# Patient Record
Sex: Male | Born: 1995
Health system: Southern US, Community
[De-identification: ages and names within clinical notes are randomized; demographics above are authoritative.]

## PROBLEM LIST (undated history)

## (undated) ENCOUNTER — Ambulatory Visit

## (undated) DIAGNOSIS — E119 Type 2 diabetes mellitus without complications: Secondary | ICD-10-CM

## (undated) DIAGNOSIS — I498 Other specified cardiac arrhythmias: Secondary | ICD-10-CM

## (undated) DIAGNOSIS — R200 Anesthesia of skin: Secondary | ICD-10-CM

## (undated) DIAGNOSIS — I10 Essential (primary) hypertension: Secondary | ICD-10-CM

## (undated) DIAGNOSIS — R202 Paresthesia of skin: Secondary | ICD-10-CM

## (undated) HISTORY — DX: Paresthesia of skin: R20.0

## (undated) HISTORY — DX: Paresthesia of skin: R20.2

## (undated) HISTORY — PX: NO PAST SURGERIES: SHX2092

## (undated) HISTORY — DX: Other specified cardiac arrhythmias: I49.8

---
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
## 2017-02-20 ENCOUNTER — Encounter (HOSPITAL_COMMUNITY): Payer: Self-pay | Admitting: *Deleted

## 2017-02-20 ENCOUNTER — Emergency Department (HOSPITAL_COMMUNITY)
Admission: EM | Admit: 2017-02-20 | Discharge: 2017-02-20 | Disposition: A | Discharge status: Home / Self Care | Payer: Self-pay | Attending: Emergency Medicine | Admitting: Emergency Medicine

## 2017-02-20 DIAGNOSIS — Z79899 Other long term (current) drug therapy: Secondary | ICD-10-CM | POA: Insufficient documentation

## 2017-02-20 DIAGNOSIS — Z794 Long term (current) use of insulin: Secondary | ICD-10-CM | POA: Insufficient documentation

## 2017-02-20 DIAGNOSIS — R739 Hyperglycemia, unspecified: Secondary | ICD-10-CM

## 2017-02-20 DIAGNOSIS — R51 Headache: Secondary | ICD-10-CM | POA: Insufficient documentation

## 2017-02-20 DIAGNOSIS — E1165 Type 2 diabetes mellitus with hyperglycemia: Secondary | ICD-10-CM | POA: Insufficient documentation

## 2017-02-20 DIAGNOSIS — I10 Essential (primary) hypertension: Secondary | ICD-10-CM | POA: Insufficient documentation

## 2017-02-20 LAB — URINALYSIS, ROUTINE W REFLEX MICROSCOPIC
Bacteria, UA: NONE SEEN
Bilirubin Urine: NEGATIVE
Glucose, UA: >=500 mg/dL — AB
Hgb urine dipstick: NEGATIVE
Ketones, ur: NEGATIVE mg/dL
Leukocytes, UA: NEGATIVE
Nitrite: NEGATIVE
Protein, ur: 30 mg/dL — AB
Specific Gravity, Urine: 1.027 (ref 1.005–1.030)
Squamous Epithelial / HPF: NONE SEEN
pH: 5 (ref 5.0–8.0)

## 2017-02-20 LAB — CBC
HEMATOCRIT: 43.2 % (ref 39.0–52.0)
Hemoglobin: 14.9 g/dL (ref 13.0–17.0)
MCH: 27.9 pg (ref 26.0–34.0)
MCHC: 34.5 g/dL (ref 30.0–36.0)
MCV: 80.7 fL (ref 78.0–100.0)
PLATELETS: 336 10*3/uL (ref 150–400)
RBC: 5.35 MIL/uL (ref 4.22–5.81)
RDW: 12.4 % (ref 11.5–15.5)
WBC: 8.4 10*3/uL (ref 4.0–10.5)

## 2017-02-20 LAB — COMPREHENSIVE METABOLIC PANEL
ALT: 58 U/L (ref 17–63)
AST: 31 U/L (ref 15–41)
Albumin: 4.1 g/dL (ref 3.5–5.0)
Alkaline Phosphatase: 83 U/L (ref 38–126)
Anion gap: 9 (ref 5–15)
BILIRUBIN TOTAL: 0.5 mg/dL (ref 0.3–1.2)
BUN: 6 mg/dL (ref 6–20)
CO2: 24 mmol/L (ref 22–32)
Calcium: 9.1 mg/dL (ref 8.9–10.3)
Chloride: 100 mmol/L — ABNORMAL LOW (ref 101–111)
Creatinine, Ser: 0.87 mg/dL (ref 0.61–1.24)
GFR calc Af Amer: >60 mL/min (ref >60)
GFR calc non Af Amer: >60 mL/min (ref >60)
Glucose, Bld: 308 mg/dL — ABNORMAL HIGH (ref 65–99)
POTASSIUM: 4.2 mmol/L (ref 3.5–5.1)
SODIUM: 133 mmol/L — AB (ref 135–145)
TOTAL PROTEIN: 7.6 g/dL (ref 6.5–8.1)

## 2017-02-20 LAB — CBG MONITORING, ED
Glucose-Capillary: 303 mg/dL — ABNORMAL HIGH (ref 65–99)
Glucose-Capillary: 196 mg/dL — ABNORMAL HIGH (ref 65–99)

## 2017-02-20 LAB — LIPASE, BLOOD: LIPASE: 23 U/L (ref 11–51)

## 2017-02-20 MED ORDER — SODIUM CHLORIDE 0.9 % IV BOLUS (SEPSIS)
1000.0000 mL | Freq: Once | INTRAVENOUS | Status: AC
  Administered 2017-02-20: 1000 mL via INTRAVENOUS

## 2017-02-20 MED ORDER — LISINOPRIL 5 MG PO TABS: 5.0000 mg | ORAL_TABLET | Freq: Every day | ORAL | 1 refills | Status: DC

## 2017-02-20 MED ORDER — METFORMIN HCL 500 MG PO TABS: 500.0000 mg | ORAL_TABLET | Freq: Two times a day (BID) | ORAL | 1 refills | Status: DC

## 2017-02-20 MED ORDER — INSULIN ASPART 100 UNIT/ML ~~LOC~~ SOLN
10.0000 [IU] | Freq: Once | SUBCUTANEOUS | Status: AC
  Administered 2017-02-20: 10 [IU] via SUBCUTANEOUS
  Filled 2017-02-20: qty 1

## 2017-02-20 MED ORDER — INSULIN ASPART PROT & ASPART (70-30 MIX) 100 UNIT/ML PEN: 28.0000 [IU] | PEN_INJECTOR | SUBCUTANEOUS | 5 refills | Status: DC

## 2017-02-20 MED ORDER — ONDANSETRON HCL 4 MG/2ML IJ SOLN
4.0000 mg | Freq: Once | INTRAMUSCULAR | Status: AC
  Administered 2017-02-20: 4 mg via INTRAVENOUS
  Filled 2017-02-20: qty 2

## 2017-02-20 MED ORDER — METFORMIN HCL 500 MG PO TABS
500.0000 mg | ORAL_TABLET | Freq: Once | ORAL | Status: AC
  Administered 2017-02-20: 500 mg via ORAL
  Filled 2017-02-20: qty 1

## 2017-02-20 MED ORDER — ONDANSETRON HCL 4 MG PO TABS: 4.0000 mg | ORAL_TABLET | Freq: Three times a day (TID) | ORAL | 0 refills | Status: DC | PRN

## 2017-02-20 MED ORDER — LISINOPRIL 2.5 MG PO TABS
5.0000 mg | ORAL_TABLET | Freq: Once | ORAL | Status: AC
  Administered 2017-02-20: 5 mg via ORAL
  Filled 2017-02-20: qty 2

## 2017-02-20 NOTE — Care Management (Signed)
ED CM consulted by Dr. Dayna Barker concerning medication assistance and follow up patient presented to [facility] ED with c/o elevated CBG with nausea and abdominal pain. ED CM met with patient and mother at bedside to discuss medication assistance and establishing care. Patient lost Medicaid when he turned 19 last year and has not been able to afford follow up care. Patient has been sharing medications.  CM discussed MATCH  program and the guidelines including the  $3 co-pay per prescription. Pt verbalized understanding and is agreeable with accepting the assistance.  Pt enrolled and [place] letter printed and given to patient with list of participating pharmacies.  Patient was instructed to present  prescriptions with [employer] letter to a participating Pharmacies from the list providedPatient is an insulin dependent diabetic.

## 2017-02-20 NOTE — ED Triage Notes (Signed)
pT ARRIVED VIA [facility] WITH OUT OF MEDS FOR DM AND HTN FOR ONE WEEK.  PT IS HERE WITH ABDOMINAL PAIN, NAUSEA, VOMITING, HEADACHE, CHILLS, BLURRY VISION.  NSL AND RECEIVED 100 CC OF NS EN ROUTE TO ED.  CBG 253 FOR EMS.  RECEIVED ZOFRAN  BY EMS.

## 2017-02-20 NOTE — ED Provider Notes (Signed)
Emergency Department Provider Note   I have reviewed the triage vital signs and the nursing notes.   HISTORY  Chief Complaint Abdominal Pain; Hyperglycemia; and Headache   HPI Logan Dunn is a 21 y.o. male  with a past medical history of diabetes and hypertension presents to the emergency department today secondary to a mild headache along with nausea, pyuria and hyperglycemia secondary to being out of his medications for at least a week. Patient recently came off of Medicaid and has not been able to afford his prescriptions. Does not have a primary doctor for the same reason. Patient had last bowel movementlast night which was normal. He is a couple episodes of nonbloody nonbilious vomiting today. All over abdominal achiness but no specific pain in one area or the other. No urinary symptoms besides urinate more often than normal.has been in DKA one time before when his diagnosis. Otherwise has been relatively well-controlled. No chest pain, cough, fever or other infectious symptoms.   Past Medical History:  Diagnosis Date   Diabetes (HCC)    Hypertension     There are no active problems to display for this patient.   History reviewed. No pertinent surgical history.    Allergies Patient has no known allergies.  No family history on file.  Social History Social History  Substance Use Topics   Smoking status: Never Smoker   Smokeless tobacco: Never Used   Alcohol use No    Review of Systems  All other systems negative except as documented in the HPI. All pertinent positives and negatives as reviewed in the HPI. ____________________________________________   PHYSICAL EXAM:  VITAL SIGNS: ED Triage Vitals  Enc Vitals Group     BP 02/20/17 1311 (!) 151/95     Pulse Rate 02/20/17 1311 74     Resp 02/20/17 1311 18     Temp 02/20/17 1311 98.4 F (36.9 C)     Temp src --      SpO2 02/20/17 1311 96 %     Weight --      Height --      Head Circumference  --      Peak Flow --      Pain Score 02/20/17 1302 10     Pain Loc --      Pain Edu? --      Excl. in GC? --     Constitutional: Alert and oriented. Well appearing and in no acute distress. Eyes: Conjunctivae are normal. PERRL. EOMI. Head: Atraumatic. Nose: No congestion/rhinnorhea. Mouth/Throat: Mucous membranes are moist.  Oropharynx non-erythematous. Neck: No stridor.  No meningeal signs.   Cardiovascular: Normal rate, regular rhythm. Good peripheral circulation. Grossly normal heart sounds.   Respiratory: Normal respiratory effort.  No retractions. Lungs CTAB. Gastrointestinal: Soft and nontender. No distention.  Musculoskeletal: No lower extremity tenderness nor edema. No gross deformities of extremities. Neurologic:  Normal speech and language. No gross focal neurologic deficits are appreciated.  Skin:  Skin is warm, dry and intact. No rash noted.   ____________________________________________   LABS (all labs ordered are listed, but only abnormal results are displayed)  Labs Reviewed  COMPREHENSIVE METABOLIC PANEL - Abnormal; Notable for the following:       Result Value   Sodium 133 (*)    Chloride 100 (*)    Glucose, Bld 308 (*)    All other components within normal limits  URINALYSIS, ROUTINE W REFLEX MICROSCOPIC - Abnormal; Notable for the following:    Glucose, UA >=500 (*)  Protein, ur 30 (*)    All other components within normal limits  CBG MONITORING, ED - Abnormal; Notable for the following:    Glucose-Capillary 303 (*)    All other components within normal limits  CBG MONITORING, ED - Abnormal; Notable for the following:    Glucose-Capillary 196 (*)    All other components within normal limits  LIPASE, BLOOD  CBC   ____________________________________________  EKG   EKG Interpretation  Date/Time:    Ventricular Rate:    PR Interval:    QRS Duration:   QT Interval:    QTC Calculation:   R Axis:     Text Interpretation:          ____________________________________________  RADIOLOGY  No results found.  ____________________________________________   PROCEDURES  Procedure(s) performed:   Procedures   ____________________________________________   INITIAL IMPRESSION / ASSESSMENT AND PLAN / ED COURSE  Pertinent labs & imaging results that were available during my care of the patient were reviewed by me and considered in my medical decision making (see chart for details).  No evidence of DKA. Still awaiting urine. We'll give insulin, fluids and engage case management to help with obtaining medication and PCP follow-up.  Case management gave assistance for medications and will work on helping obtain PCP for further follow up.   Symptoms improved. Tolerating PO. Stable for discharge. Prescriptions given.  ____________________________________________  FINAL CLINICAL IMPRESSION(S) / ED DIAGNOSES  Final diagnoses:  Hyperglycemia     MEDICATIONS GIVEN DURING THIS VISIT:  Medications  sodium chloride 0.9 % bolus 1,000 mL (0 mLs Intravenous Stopped 02/20/17 1910)  ondansetron (ZOFRAN) injection 4 mg (4 mg Intravenous Given 02/20/17 1803)  insulin aspart (novoLOG) injection 10 Units (10 Units Subcutaneous Given 02/20/17 1810)  metFORMIN (GLUCOPHAGE) tablet 500 mg (500 mg Oral Given 02/20/17 1852)  lisinopril (PRINIVIL,ZESTRIL) tablet 5 mg (5 mg Oral Given 02/20/17 1855)     NEW OUTPATIENT MEDICATIONS STARTED DURING THIS VISIT:  Discharge Medication List as of 02/20/2017  6:41 PM    START taking these medications   Details  ondansetron (ZOFRAN) 4 MG tablet Take 1 tablet (4 mg total) by mouth every 8 (eight) hours as needed for nausea or vomiting., Starting Mon 02/20/2017, Print        Note:  This document was prepared using Dragon voice recognition software and may include unintentional dictation errors.   Marily Memos, MD 02/20/17 2130

## 2017-09-09 ENCOUNTER — Encounter (HOSPITAL_COMMUNITY): Payer: Self-pay

## 2017-09-09 ENCOUNTER — Emergency Department (HOSPITAL_COMMUNITY)
Admission: EM | Admit: 2017-09-09 | Discharge: 2017-09-10 | Disposition: A | Discharge status: Home / Self Care | Payer: Self-pay | Attending: Emergency Medicine | Admitting: Emergency Medicine

## 2017-09-09 ENCOUNTER — Other Ambulatory Visit: Payer: Self-pay

## 2017-09-09 DIAGNOSIS — I10 Essential (primary) hypertension: Secondary | ICD-10-CM | POA: Insufficient documentation

## 2017-09-09 DIAGNOSIS — Z79899 Other long term (current) drug therapy: Secondary | ICD-10-CM | POA: Insufficient documentation

## 2017-09-09 DIAGNOSIS — Z794 Long term (current) use of insulin: Secondary | ICD-10-CM | POA: Insufficient documentation

## 2017-09-09 DIAGNOSIS — E1165 Type 2 diabetes mellitus with hyperglycemia: Secondary | ICD-10-CM | POA: Insufficient documentation

## 2017-09-09 DIAGNOSIS — R739 Hyperglycemia, unspecified: Secondary | ICD-10-CM

## 2017-09-09 LAB — CBC
HCT: 46.7 % (ref 39.0–52.0)
HEMOGLOBIN: 16.7 g/dL (ref 13.0–17.0)
MCH: 28.5 pg (ref 26.0–34.0)
MCHC: 35.8 g/dL (ref 30.0–36.0)
MCV: 79.7 fL (ref 78.0–100.0)
Platelets: 373 10*3/uL (ref 150–400)
RBC: 5.86 MIL/uL — ABNORMAL HIGH (ref 4.22–5.81)
RDW: 12.7 % (ref 11.5–15.5)
WBC: 11.6 10*3/uL — ABNORMAL HIGH (ref 4.0–10.5)

## 2017-09-09 LAB — BASIC METABOLIC PANEL
ANION GAP: 12 (ref 5–15)
BUN: 12 mg/dL (ref 6–20)
CALCIUM: 9.8 mg/dL (ref 8.9–10.3)
CO2: 26 mmol/L (ref 22–32)
Chloride: 97 mmol/L — ABNORMAL LOW (ref 101–111)
Creatinine, Ser: 1.12 mg/dL (ref 0.61–1.24)
GFR calc Af Amer: >60 mL/min (ref >60)
GFR calc non Af Amer: >60 mL/min (ref >60)
GLUCOSE: 346 mg/dL — AB (ref 65–99)
Potassium: 3.3 mmol/L — ABNORMAL LOW (ref 3.5–5.1)
Sodium: 135 mmol/L (ref 135–145)

## 2017-09-09 LAB — CBG MONITORING, ED: Glucose-Capillary: 327 mg/dL — ABNORMAL HIGH (ref 65–99)

## 2017-09-09 MED ORDER — SODIUM CHLORIDE 0.9 % IV BOLUS
1000.0000 mL | Freq: Once | INTRAVENOUS | Status: AC
Start: 2017-09-10 — End: 2017-09-10
  Administered 2017-09-10: 1000 mL via INTRAVENOUS

## 2017-09-09 NOTE — ED Notes (Signed)
Pt aware urine is needed.

## 2017-09-09 NOTE — ED Triage Notes (Signed)
States PTA to ER felt shaky and blurred vision and blood sugar in the 300's.

## 2017-09-10 ENCOUNTER — Encounter (HOSPITAL_COMMUNITY): Payer: Self-pay | Admitting: Emergency Medicine

## 2017-09-10 LAB — URINALYSIS, ROUTINE W REFLEX MICROSCOPIC
Bacteria, UA: NONE SEEN
Bilirubin Urine: NEGATIVE
Glucose, UA: >=500 mg/dL — AB
KETONES UR: 5 mg/dL — AB
Leukocytes, UA: NEGATIVE
Nitrite: NEGATIVE
PH: 5 (ref 5.0–8.0)
PROTEIN: 100 mg/dL — AB
SQUAMOUS EPITHELIAL / LPF: NONE SEEN
Specific Gravity, Urine: 1.033 — ABNORMAL HIGH (ref 1.005–1.030)

## 2017-09-10 LAB — CBG MONITORING, ED
GLUCOSE-CAPILLARY: 268 mg/dL — AB (ref 65–99)
Glucose-Capillary: 255 mg/dL — ABNORMAL HIGH (ref 65–99)

## 2017-09-10 MED ORDER — METFORMIN HCL 500 MG PO TABS
500.0000 mg | ORAL_TABLET | ORAL | Status: AC
  Administered 2017-09-10: 500 mg via ORAL
  Filled 2017-09-10: qty 1

## 2017-09-10 MED ORDER — POTASSIUM CHLORIDE CRYS ER 20 MEQ PO TBCR
40.0000 meq | EXTENDED_RELEASE_TABLET | Freq: Once | ORAL | Status: AC
  Administered 2017-09-10: 40 meq via ORAL
  Filled 2017-09-10: qty 2

## 2017-09-10 MED ORDER — METFORMIN HCL 850 MG PO TABS: 850.0000 mg | ORAL_TABLET | Freq: Two times a day (BID) | ORAL | 0 refills | Status: DC

## 2017-09-10 MED ORDER — SODIUM CHLORIDE 0.9 % IV BOLUS
500.0000 mL | Freq: Once | INTRAVENOUS | Status: AC
  Administered 2017-09-10: 500 mL via INTRAVENOUS

## 2017-09-10 MED ORDER — INSULIN ASPART 100 UNIT/ML ~~LOC~~ SOLN
2.0000 [IU] | SUBCUTANEOUS | Status: AC
  Administered 2017-09-10: 2 [IU] via INTRAVENOUS

## 2017-09-10 NOTE — ED Provider Notes (Addendum)
[facility]-EMERGENCY DEPT Provider Note   CSN: [id number] Arrival date & time: 09/09/17  2203     History   Chief Complaint Chief Complaint  Patient presents with   Blurred Vision   Hyperglycemia    HPI Logan Dunn is a 22 y.o. male.  The history is provided by the patient.  Hyperglycemia  Severity:  Severe Onset quality:  Gradual Timing:  Constant Progression:  Unchanged Chronicity:  Chronic Diabetes status:  Controlled with insulin and controlled with oral medications Current diabetic therapy:  Insulin and metformin Context: noncompliance   Context comment:  Not taking insulin for a week Relieved by:  Nothing Ineffective treatments:  None tried Associated symptoms: dizziness and polyuria   Associated symptoms: no abdominal pain, no chest pain, no diaphoresis, no dysuria, no fever, no shortness of breath and no weakness   Risk factors: obesity   Was playing basketball and felt lightheaded and had charlie horses.  Knew sugar was high.  No weakness no numbness no changes in speech.    Past Medical History:  Diagnosis Date   Diabetes (HCC)    Hypertension     There are no active problems to display for this patient.   History reviewed. No pertinent surgical history.      Home Medications    Prior to Admission medications   Medication Sig Start Date End Date Taking? Authorizing Provider  insulin aspart protamine - aspart (NOVOLOG MIX 70/30 FLEXPEN) (70-30) 100 UNIT/ML FlexPen Inject 0.28-0.4 mLs (28-40 Units total) into the skin See admin instructions. 40 units in the morning and 28 units at bedtime 02/20/17  Yes Mesner, Barbara CowerJason, MD  lisinopril (PRINIVIL,ZESTRIL) 5 MG tablet Take 1 tablet (5 mg total) by mouth daily. 02/20/17  Yes Mesner, Barbara CowerJason, MD  metFORMIN (GLUCOPHAGE) 500 MG tablet Take 1 tablet (500 mg total) by mouth 2 (two) times daily with a meal. 02/20/17  Yes Mesner, Barbara CowerJason, MD  ondansetron (ZOFRAN) 4 MG tablet Take 1 tablet (4 mg  total) by mouth every 8 (eight) hours as needed for nausea or vomiting. Patient not taking: Reported on 09/10/2017 02/20/17   Mesner, Barbara CowerJason, MD    Family History History reviewed. No pertinent family history.  Social History Social History   Tobacco Use   Smoking status: Never Smoker   Smokeless tobacco: Never Used  Substance Use Topics   Alcohol use: No   Drug use: No     Allergies   Patient has no known allergies.   Review of Systems Review of Systems  Constitutional: Negative for diaphoresis and fever.  HENT: Negative for trouble swallowing and voice change.   Eyes: Negative for photophobia.  Respiratory: Negative for shortness of breath.   Cardiovascular: Negative for chest pain.  Gastrointestinal: Negative for abdominal pain.  Endocrine: Positive for polyuria.  Genitourinary: Negative for dysuria and flank pain.  Neurological: Positive for dizziness. Negative for tremors, seizures, syncope, facial asymmetry, speech difficulty, weakness, light-headedness, numbness and headaches.  All other systems reviewed and are negative.    Physical Exam Updated Vital Signs BP 136/86 (BP Location: Left Arm)    Pulse 84    Temp 98.7 F (37.1 C) (Oral)    Resp 18    Ht 6\' 2"  (1.88 m)    Wt (!) 137.1 kg (302 lb 3.2 oz)    SpO2 97%    BMI 38.80 kg/m   Physical Exam  Constitutional: He is oriented to person, place, and time. He appears well-developed and well-nourished. No distress.  HENT:  Head: Normocephalic and atraumatic.  Mouth/Throat: No oropharyngeal exudate.  Eyes: Pupils are equal, round, and reactive to light. Conjunctivae are normal.  Neck: Normal range of motion. Neck supple.  Cardiovascular: Normal rate, regular rhythm, normal heart sounds and intact distal pulses.  Pulmonary/Chest: Effort normal and breath sounds normal. No stridor. He has no wheezes. He has no rales.  Abdominal: Soft. Bowel sounds are normal. He exhibits no mass. There is no tenderness. There is  no rebound and no guarding.  Musculoskeletal: Normal range of motion.  Neurological: He is alert and oriented to person, place, and time. He displays normal reflexes. No cranial nerve deficit. He exhibits normal muscle tone. Coordination normal.  Skin: Skin is warm and dry. Capillary refill takes less than 2 seconds.     ED Treatments / Results  Labs (all labs ordered are listed, but only abnormal results are displayed) Labs Reviewed  BASIC METABOLIC PANEL - Abnormal; Notable for the following components:      Result Value   Potassium 3.3 (*)    Chloride 97 (*)    Glucose, Bld 346 (*)    All other components within normal limits  CBC - Abnormal; Notable for the following components:   WBC 11.6 (*)    RBC 5.86 (*)    All other components within normal limits  URINALYSIS, ROUTINE W REFLEX MICROSCOPIC - Abnormal; Notable for the following components:   Specific Gravity, Urine 1.033 (*)    Glucose, UA >=500 (*)    Hgb urine dipstick SMALL (*)    Ketones, ur 5 (*)    Protein, ur 100 (*)    All other components within normal limits  CBG MONITORING, ED - Abnormal; Notable for the following components:   Glucose-Capillary 327 (*)    All other components within normal limits  CBG MONITORING, ED - Abnormal; Notable for the following components:   Glucose-Capillary 268 (*)    All other components within normal limits  CBG MONITORING, ED - Abnormal; Notable for the following components:   Glucose-Capillary 255 (*)    All other components within normal limits    EKG None  Radiology No results found.  Procedures Procedures (including critical care time)  Medications Ordered in ED Medications  sodium chloride 0.9 % bolus 1,000 mL (0 mLs Intravenous Stopped 09/10/17 0050)  potassium chloride SA (K-DUR,KLOR-CON) CR tablet 40 mEq (40 mEq Oral Given 09/10/17 0030)  metFORMIN (GLUCOPHAGE) tablet 500 mg (500 mg Oral Given 09/10/17 0030)  sodium chloride 0.9 % bolus 500 mL (0 mLs Intravenous  Stopped 09/10/17 0145)  insulin aspart (novoLOG) injection 2 Units (2 Units Intravenous Given 09/10/17 0125)      Visual Acuity  Right Eye Distance: 40 Left Eye Distance: 40 Bilateral Distance: 40  Right Eye Near: R Near: 20 Left Eye Near:  L Near: 20 Bilateral Near:  20    Final Clinical Impressions(s) / ED Diagnoses   Stable for discharge.  Will increase dose of metformin.    Return for weakness, numbness, changes in vision or speech, fevers >100.4 unrelieved by medication, shortness of breath, intractable vomiting, or diarrhea, abdominal pain, Inability to tolerate liquids or food, cough, altered mental status or any concerns. No signs of systemic illness or infection. The patient is nontoxic-appearing on exam and vital signs are within normal limits.   I have reviewed the triage vital signs and the nursing notes. Pertinent labs &imaging results that were available during my care of the patient were reviewed by  me and considered in my medical decision making (see chart for details).  After history, exam, and medical workup I feel the patient has been appropriately medically screened and is safe for discharge home. Pertinent diagnoses were discussed with the patient. Patient was given return precautions.    Kristinia Leavy, MD 09/10/17 0150    Cy Blamer, MD 09/10/17 [id number]

## 2017-10-01 ENCOUNTER — Other Ambulatory Visit: Payer: Self-pay | Admitting: Pediatric Endocrinology

## 2017-10-01 DIAGNOSIS — E119 Type 2 diabetes mellitus without complications: Secondary | ICD-10-CM

## 2017-12-24 ENCOUNTER — Other Ambulatory Visit: Payer: Self-pay | Admitting: Pediatric Endocrinology

## 2017-12-24 DIAGNOSIS — E119 Type 2 diabetes mellitus without complications: Secondary | ICD-10-CM

## 2018-01-21 ENCOUNTER — Other Ambulatory Visit: Payer: Self-pay | Admitting: Pediatric Endocrinology

## 2018-01-21 DIAGNOSIS — E119 Type 2 diabetes mellitus without complications: Secondary | ICD-10-CM

## 2018-01-23 ENCOUNTER — Other Ambulatory Visit: Payer: Self-pay | Admitting: Pediatric Endocrinology

## 2018-01-23 DIAGNOSIS — E119 Type 2 diabetes mellitus without complications: Secondary | ICD-10-CM

## 2018-02-05 ENCOUNTER — Encounter (HOSPITAL_COMMUNITY): Payer: Self-pay | Admitting: Emergency Medicine

## 2018-02-05 ENCOUNTER — Emergency Department (HOSPITAL_COMMUNITY)
Admission: EM | Admit: 2018-02-05 | Discharge: 2018-02-06 | Disposition: A | Discharge status: Home / Self Care | Payer: Self-pay | Attending: Emergency Medicine | Admitting: Emergency Medicine

## 2018-02-05 ENCOUNTER — Other Ambulatory Visit: Payer: Self-pay

## 2018-02-05 DIAGNOSIS — Z9114 Patient's other noncompliance with medication regimen: Secondary | ICD-10-CM | POA: Insufficient documentation

## 2018-02-05 DIAGNOSIS — R739 Hyperglycemia, unspecified: Secondary | ICD-10-CM

## 2018-02-05 DIAGNOSIS — H538 Other visual disturbances: Secondary | ICD-10-CM | POA: Insufficient documentation

## 2018-02-05 DIAGNOSIS — Z794 Long term (current) use of insulin: Secondary | ICD-10-CM | POA: Insufficient documentation

## 2018-02-05 DIAGNOSIS — E1165 Type 2 diabetes mellitus with hyperglycemia: Secondary | ICD-10-CM | POA: Insufficient documentation

## 2018-02-05 DIAGNOSIS — Z79899 Other long term (current) drug therapy: Secondary | ICD-10-CM | POA: Insufficient documentation

## 2018-02-05 DIAGNOSIS — I10 Essential (primary) hypertension: Secondary | ICD-10-CM | POA: Insufficient documentation

## 2018-02-05 LAB — URINALYSIS, ROUTINE W REFLEX MICROSCOPIC
BILIRUBIN URINE: NEGATIVE
Bacteria, UA: NONE SEEN
KETONES UR: NEGATIVE mg/dL
Leukocytes, UA: NEGATIVE
NITRITE: NEGATIVE
PROTEIN: 100 mg/dL — AB
Specific Gravity, Urine: 1.031 — ABNORMAL HIGH (ref 1.005–1.030)
pH: 5 (ref 5.0–8.0)

## 2018-02-05 LAB — CBC WITH DIFFERENTIAL/PLATELET
Abs Immature Granulocytes: 0 10*3/uL (ref 0.0–0.1)
BASOS ABS: 0 10*3/uL (ref 0.0–0.1)
BASOS PCT: 0 %
EOS ABS: 0.2 10*3/uL (ref 0.0–0.7)
EOS PCT: 1 %
HCT: 47.1 % (ref 39.0–52.0)
Hemoglobin: 15.5 g/dL (ref 13.0–17.0)
Immature Granulocytes: 0 %
Lymphocytes Relative: 34 %
Lymphs Abs: 3.6 10*3/uL (ref 0.7–4.0)
MCH: 27.7 pg (ref 26.0–34.0)
MCHC: 32.9 g/dL (ref 30.0–36.0)
MCV: 84.3 fL (ref 78.0–100.0)
Monocytes Absolute: 0.6 10*3/uL (ref 0.1–1.0)
Monocytes Relative: 5 %
Neutro Abs: 6.2 10*3/uL (ref 1.7–7.7)
Neutrophils Relative %: 60 %
PLATELETS: 362 10*3/uL (ref 150–400)
RBC: 5.59 MIL/uL (ref 4.22–5.81)
RDW: 12.5 % (ref 11.5–15.5)
WBC: 10.5 10*3/uL (ref 4.0–10.5)

## 2018-02-05 LAB — COMPREHENSIVE METABOLIC PANEL
ALT: 44 U/L (ref 0–44)
ANION GAP: 9 (ref 5–15)
AST: 26 U/L (ref 15–41)
Albumin: 4.1 g/dL (ref 3.5–5.0)
Alkaline Phosphatase: 72 U/L (ref 38–126)
BUN: 7 mg/dL (ref 6–20)
CALCIUM: 9.6 mg/dL (ref 8.9–10.3)
CO2: 30 mmol/L (ref 22–32)
CREATININE: 0.93 mg/dL (ref 0.61–1.24)
Chloride: 97 mmol/L — ABNORMAL LOW (ref 98–111)
GFR calc non Af Amer: >60 mL/min (ref >60)
Glucose, Bld: 248 mg/dL — ABNORMAL HIGH (ref 70–99)
Potassium: 3.8 mmol/L (ref 3.5–5.1)
SODIUM: 136 mmol/L (ref 135–145)
TOTAL PROTEIN: 7.8 g/dL (ref 6.5–8.1)
Total Bilirubin: 0.8 mg/dL (ref 0.3–1.2)

## 2018-02-05 LAB — CBG MONITORING, ED: Glucose-Capillary: 248 mg/dL — ABNORMAL HIGH (ref 70–99)

## 2018-02-05 NOTE — ED Notes (Signed)
CBG RESULT 248, RN AWARE

## 2018-02-05 NOTE — ED Triage Notes (Signed)
Patient presents with multiple complaints : blurred vision , headache with chills , denies fever , he has not taken his insulin for 2 weeks , respirations unlabored /alert and oriented .

## 2018-02-06 LAB — CBG MONITORING, ED: GLUCOSE-CAPILLARY: 273 mg/dL — AB (ref 70–99)

## 2018-02-06 MED ORDER — INSULIN ASPART 100 UNIT/ML ~~LOC~~ SOLN
5.0000 [IU] | Freq: Once | SUBCUTANEOUS | Status: AC
  Administered 2018-02-06: 5 [IU] via SUBCUTANEOUS
  Filled 2018-02-06: qty 1

## 2018-02-06 MED ORDER — METFORMIN HCL 850 MG PO TABS
850.0000 mg | ORAL_TABLET | Freq: Once | ORAL | Status: AC
  Administered 2018-02-06: 850 mg via ORAL
  Filled 2018-02-06: qty 1

## 2018-02-06 MED ORDER — KETOROLAC TROMETHAMINE 30 MG/ML IJ SOLN
15.0000 mg | Freq: Once | INTRAMUSCULAR | Status: AC
  Administered 2018-02-06: 15 mg via INTRAVENOUS
  Filled 2018-02-06: qty 1

## 2018-02-06 MED ORDER — SODIUM CHLORIDE 0.9 % IV BOLUS
1000.0000 mL | Freq: Once | INTRAVENOUS | Status: AC
  Administered 2018-02-06: 1000 mL via INTRAVENOUS

## 2018-02-06 MED ORDER — METOCLOPRAMIDE HCL 5 MG/ML IJ SOLN
10.0000 mg | INTRAMUSCULAR | Status: AC
  Administered 2018-02-06: 10 mg via INTRAVENOUS
  Filled 2018-02-06: qty 2

## 2018-02-06 MED ORDER — METFORMIN HCL 850 MG PO TABS: 850.0000 mg | ORAL_TABLET | Freq: Two times a day (BID) | ORAL | 0 refills | Status: DC

## 2018-02-06 MED ORDER — INSULIN ASPART PROT & ASPART (70-30 MIX) 100 UNIT/ML PEN: 28.0000 [IU] | PEN_INJECTOR | SUBCUTANEOUS | 5 refills | Status: DC

## 2018-02-06 NOTE — ED Provider Notes (Signed)
[facility] EMERGENCY DEPARTMENT Provider Note   CSN: [id number] Arrival date & time: 02/05/18  2220     History   Chief Complaint Chief Complaint  Patient presents with   Diabetes    Blurred Vision/Headache/Chills     HPI Logan Dunn is a 22 y.o. male.   22 y/o male with hx of DM and HTN presents to the ED for multiple complaints.  States that he has been out of his insulin and metformin for the past 2 weeks.  Has been unable to follow-up with a primary care doctor due to lack of insurance.  Patient has been experiencing ongoing, intermittent blurry vision in his bilateral eyes.  This improves when his hyperglycemia is better controlled.  Seen for these complaints back in April as well.  Was unable to follow-up with an ophthalmologist, also due to lack of insurance.  No hx of complete and total loss of vision.  Further complaining of intermittent abdominal cramping, generalized and global headache, chills.  Has experienced these symptoms in the past when off of his metformin and insulin.  Has not experienced any vomiting or diarrhea.  Denies documented fever.     Past Medical History:  Diagnosis Date   Diabetes (HCC)    Hypertension     There are no active problems to display for this patient.   History reviewed. No pertinent surgical history.      Home Medications    Prior to Admission medications   Medication Sig Start Date End Date Taking? Authorizing Provider  insulin aspart protamine - aspart (NOVOLOG MIX 70/30 FLEXPEN) (70-30) 100 UNIT/ML FlexPen Inject 0.28-0.4 mLs (28-40 Units total) into the skin See admin instructions. 40 units in the morning and 28 units at bedtime 02/06/18   Antony Madura, PA-C  lisinopril (PRINIVIL,ZESTRIL) 5 MG tablet Take 1 tablet (5 mg total) by mouth daily. 02/20/17   Mesner, Barbara Cower, MD  metFORMIN (GLUCOPHAGE) 850 MG tablet Take 1 tablet (850 mg total) by mouth 2 (two) times daily with a meal. 02/06/18   Antony Madura,  PA-C  ondansetron (ZOFRAN) 4 MG tablet Take 1 tablet (4 mg total) by mouth every 8 (eight) hours as needed for nausea or vomiting. Patient not taking: Reported on 09/10/2017 02/20/17   Mesner, Barbara Cower, MD    Family History No family history on file.  Social History Social History   Tobacco Use   Smoking status: Never Smoker   Smokeless tobacco: Never Used  Substance Use Topics   Alcohol use: No   Drug use: No     Allergies   Patient has no known allergies.   Review of Systems Review of Systems Ten systems reviewed and are negative for acute change, except as noted in the HPI.    Physical Exam Updated Vital Signs BP 131/83    Pulse (!) 48    Temp 98.4 F (36.9 C) (Oral)    Resp 18    SpO2 98%   Physical Exam  Constitutional: He is oriented to person, place, and time. He appears well-developed and well-nourished. No distress.  Nontoxic appearing and in NAD  HENT:  Head: Normocephalic and atraumatic.  Eyes: Conjunctivae and EOM are normal. No scleral icterus.  Neck: Normal range of motion.  Cardiovascular: Normal rate, regular rhythm and intact distal pulses.  Pulmonary/Chest: Effort normal. No stridor. No respiratory distress. He has no wheezes.  Respirations even and unlabored  Abdominal: Soft. He exhibits no distension and no mass. There is no tenderness. There  is no guarding.  Soft, nontender abdomen. Obese.  Musculoskeletal: Normal range of motion.  Neurological: He is alert and oriented to person, place, and time. He exhibits normal muscle tone. Coordination normal.  GCS 15. Moving all extremities spontaneously.  Skin: Skin is warm and dry. No rash noted. He is not diaphoretic. No erythema. No pallor.  Psychiatric: He has a normal mood and affect. His behavior is normal.  Nursing note and vitals reviewed.    ED Treatments / Results  Labs (all labs ordered are listed, but only abnormal results are displayed) Labs Reviewed  URINALYSIS, ROUTINE W REFLEX  MICROSCOPIC - Abnormal; Notable for the following components:      Result Value   Specific Gravity, Urine 1.031 (*)    Glucose, UA >=500 (*)    Hgb urine dipstick SMALL (*)    Protein, ur 100 (*)    All other components within normal limits  COMPREHENSIVE METABOLIC PANEL - Abnormal; Notable for the following components:   Chloride 97 (*)    Glucose, Bld 248 (*)    All other components within normal limits  CBG MONITORING, ED - Abnormal; Notable for the following components:   Glucose-Capillary 248 (*)    All other components within normal limits  CBG MONITORING, ED - Abnormal; Notable for the following components:   Glucose-Capillary 273 (*)    All other components within normal limits  CBC WITH DIFFERENTIAL/PLATELET    EKG None  Radiology No results found.  Procedures Procedures (including critical care time)  Medications Ordered in ED Medications  sodium chloride 0.9 % bolus 1,000 mL (0 mLs Intravenous Stopped 02/06/18 0324)  metFORMIN (GLUCOPHAGE) tablet 850 mg (850 mg Oral Given 02/06/18 0323)  ketorolac (TORADOL) 30 MG/ML injection 15 mg (15 mg Intravenous Given 02/06/18 0205)  metoCLOPramide (REGLAN) injection 10 mg (10 mg Intravenous Given 02/06/18 0205)  insulin aspart (novoLOG) injection 5 Units (5 Units Subcutaneous Given 02/06/18 0324)     Initial Impression / Assessment and Plan / ED Course  I have reviewed the triage vital signs and the nursing notes.  Pertinent labs & imaging results that were available during my care of the patient were reviewed by me and considered in my medical decision making (see chart for details).     22 year old male presenting to the emergency department for multiple complaints.  It is likely that these ongoing symptoms are secondary to poorly controlled diabetes.  The patient has been off of his insulin and metformin for the past 2 weeks.  Denies having access to follow-up for medication refills secondary to lapse in insurance coverage.   Further noting intermittent bilateral blurry vision, most consistent with diabetic retinopathy.  States that vision changes improve when blood sugar is better controlled.  He has had recurrence of his since running out of his diabetes medications.  Seen for similar complaints in April 2019.  Patient given insulin in the emergency department as well as IV fluids for management of hyperglycemia.  No evidence of DKA on laboratory evaluation.  His headache has improved following Reglan and Toradol.  Plan for outpatient referral to ophthalmology as well as instruction for primary care follow-up.  The patient was given prescriptions for refills of his metformin and NovoLog 70/30.  Return precautions discussed and provided. Patient discharged in stable condition with no unaddressed concerns.   Final Clinical Impressions(s) / ED Diagnoses   Final diagnoses:  Hyperglycemia  Noncompliance with medication regimen  Blurry vision, bilateral    ED Discharge Orders  Ordered    insulin aspart protamine - aspart (NOVOLOG MIX 70/30 FLEXPEN) (70-30) 100 UNIT/ML FlexPen  See admin instructions     02/06/18 0348    metFORMIN (GLUCOPHAGE) 850 MG tablet  2 times daily with meals     02/06/18 0349           Antony Madura, PA-C 02/06/18 [id number]    Derwood Kaplan, MD 02/08/18 0345

## 2018-02-06 NOTE — ED Notes (Signed)
Does not have glasses at home, but knows he is supposed to wear them.

## 2018-02-06 NOTE — ED Notes (Signed)
CBG 273

## 2018-02-06 NOTE — Discharge Instructions (Addendum)
We strongly recommend that you follow-up with an eye doctor regarding your ongoing blurry vision.  Restart your diabetes medications and follow-up with a primary care doctor for recheck of your sugar levels.  Try to limit your daily intake of carbohydrates and sugary foods or drinks.  Take Tylenol for any persistent headache.  You may return to the emergency department for new or concerning symptoms.

## 2018-02-12 ENCOUNTER — Telehealth: Payer: Self-pay | Admitting: Family Medicine

## 2018-02-12 NOTE — Telephone Encounter (Signed)
Called to schedule appt with new PCP, but no answer or voicemail.

## 2018-03-08 MED FILL — !NOVOLOG MIX 70/30 VIAL: 70-30/ML | 29 days supply | Qty: 20 | Fill #0

## 2019-03-13 ENCOUNTER — Emergency Department (HOSPITAL_COMMUNITY)
Admission: EM | Admit: 2019-03-13 | Discharge: 2019-03-13 | Disposition: A | Discharge status: Home / Self Care | Payer: Self-pay | Attending: Emergency Medicine | Admitting: Emergency Medicine

## 2019-03-13 ENCOUNTER — Other Ambulatory Visit: Payer: Self-pay

## 2019-03-13 ENCOUNTER — Encounter (HOSPITAL_COMMUNITY): Payer: Self-pay

## 2019-03-13 DIAGNOSIS — R739 Hyperglycemia, unspecified: Secondary | ICD-10-CM

## 2019-03-13 DIAGNOSIS — Z794 Long term (current) use of insulin: Secondary | ICD-10-CM | POA: Insufficient documentation

## 2019-03-13 DIAGNOSIS — I10 Essential (primary) hypertension: Secondary | ICD-10-CM | POA: Insufficient documentation

## 2019-03-13 DIAGNOSIS — E1165 Type 2 diabetes mellitus with hyperglycemia: Secondary | ICD-10-CM | POA: Insufficient documentation

## 2019-03-13 LAB — CBC
HCT: 48.7 % (ref 39.0–52.0)
Hemoglobin: 17 g/dL (ref 13.0–17.0)
MCH: 29.1 pg (ref 26.0–34.0)
MCHC: 34.9 g/dL (ref 30.0–36.0)
MCV: 83.4 fL (ref 80.0–100.0)
Platelets: 344 10*3/uL (ref 150–400)
RBC: 5.84 MIL/uL — ABNORMAL HIGH (ref 4.22–5.81)
RDW: 12.2 % (ref 11.5–15.5)
WBC: 8.2 10*3/uL (ref 4.0–10.5)
nRBC: 0 % (ref 0.0–0.2)

## 2019-03-13 LAB — BASIC METABOLIC PANEL
Anion gap: 13 (ref 5–15)
BUN: 10 mg/dL (ref 6–20)
CO2: 26 mmol/L (ref 22–32)
Calcium: 9.8 mg/dL (ref 8.9–10.3)
Chloride: 93 mmol/L — ABNORMAL LOW (ref 98–111)
Creatinine, Ser: 0.95 mg/dL (ref 0.61–1.24)
GFR calc Af Amer: >60 mL/min (ref >60)
GFR calc non Af Amer: >60 mL/min (ref >60)
Glucose, Bld: 388 mg/dL — ABNORMAL HIGH (ref 70–99)
Potassium: 4.6 mmol/L (ref 3.5–5.1)
Sodium: 132 mmol/L — ABNORMAL LOW (ref 135–145)

## 2019-03-13 LAB — URINALYSIS, ROUTINE W REFLEX MICROSCOPIC
Bacteria, UA: NONE SEEN
Bilirubin Urine: NEGATIVE
Glucose, UA: >=500 mg/dL — AB
Hgb urine dipstick: NEGATIVE
Ketones, ur: NEGATIVE mg/dL
Leukocytes,Ua: NEGATIVE
Nitrite: NEGATIVE
Protein, ur: NEGATIVE mg/dL
Specific Gravity, Urine: 1.031 — ABNORMAL HIGH (ref 1.005–1.030)
pH: 5 (ref 5.0–8.0)

## 2019-03-13 LAB — CBG MONITORING, ED
Glucose-Capillary: 370 mg/dL — ABNORMAL HIGH (ref 70–99)
Glucose-Capillary: 219 mg/dL — ABNORMAL HIGH (ref 70–99)

## 2019-03-13 MED ORDER — LISINOPRIL 5 MG PO TABS: 5.0000 mg | ORAL_TABLET | Freq: Every day | ORAL | 0 refills | Status: DC

## 2019-03-13 MED ORDER — INSULIN ASPART 100 UNIT/ML ~~LOC~~ SOLN
10.0000 [IU] | Freq: Once | SUBCUTANEOUS | Status: AC
  Administered 2019-03-13: 10 [IU] via SUBCUTANEOUS

## 2019-03-13 MED ORDER — METFORMIN HCL 850 MG PO TABS: 850.0000 mg | ORAL_TABLET | Freq: Two times a day (BID) | ORAL | 0 refills | Status: DC

## 2019-03-13 MED ORDER — SODIUM CHLORIDE 0.9 % IV BOLUS
1000.0000 mL | Freq: Once | INTRAVENOUS | Status: AC
  Administered 2019-03-13: 1000 mL via INTRAVENOUS

## 2019-03-13 MED ORDER — NOVOLOG MIX 70/30 FLEXPEN (70-30) 100 UNIT/ML ~~LOC~~ SUPN: 28.0000 [IU] | PEN_INJECTOR | SUBCUTANEOUS | 0 refills | Status: DC

## 2019-03-13 NOTE — Discharge Instructions (Addendum)
Please begin taking your medications again as directed and call to schedule a close follow-up appointment with [facility] to help make sure you are getting a steady supply of your diabetes medications.  Return to the ED for any new or worsening symptoms.

## 2019-03-13 NOTE — Care Management (Signed)
ED CM met with patient at bedside, patient reports being dropped last month from [facility] after he turned 23.  Patient is a diabetic and hypertensive on insulin.  Patient has been off meds since September.  Discussed MATCH  Program, patient was enrolled and given letter with instructions on how to redeem. Patient also given information on how to follow up at the [facility].

## 2019-03-13 NOTE — ED Provider Notes (Signed)
[facility] EMERGENCY DEPARTMENT Provider Note   CSN: [id number] Arrival date & time: 03/13/19  1229     History   Chief Complaint Chief Complaint  Patient presents with  . Hyperglycemia    HPI Logan Dunn is a 23 y.o. male.     Logan Dunn is a 23 y.o. male with a history of hypertension and type 2 diabetes, who presents to the ED for evaluation of hyperglycemia, fatigue, polyuria and polydipsia.  Symptoms have been persistent and worsening.  Patient reports symptoms have been present and worsening over the past week.  He has not had any fevers or chills.  No abdominal pain, nausea or vomiting.,  No chest pain, shortness of breath or cough.  He reports that a few months ago he ran out of his insulin and metformin, and was unable to get these medications filled due to financial issues.  Patient reports that when he turned 23 he lost his Medicaid coverage and can now no longer afford medications.  No other aggravating or alleviating factors.     Past Medical History:  Diagnosis Date  . Diabetes (HCC)   . Hypertension     There are no active problems to display for this patient.   History reviewed. No pertinent surgical history.      Home Medications    Prior to Admission medications   Medication Sig Start Date End Date Taking? Authorizing Provider  ibuprofen (ADVIL) 200 MG tablet Take 800 mg by mouth every 6 (six) hours as needed for mild pain or moderate pain.   Yes [provider]  insulin aspart protamine - aspart (NOVOLOG MIX 70/30 FLEXPEN) (70-30) 100 UNIT/ML FlexPen Inject 0.28-0.4 mLs (28-40 Units total) into the skin See admin instructions. 40 units in the morning and 28 units at bedtime 03/13/19   Dartha Lodge, PA-C  lisinopril (ZESTRIL) 5 MG tablet Take 1 tablet (5 mg total) by mouth daily. 03/13/19   Dartha Lodge, PA-C  metFORMIN (GLUCOPHAGE) 850 MG tablet Take 1 tablet (850 mg total) by mouth 2 (two) times daily with a meal.  03/13/19   Dartha Lodge, PA-C  ondansetron (ZOFRAN) 4 MG tablet Take 1 tablet (4 mg total) by mouth every 8 (eight) hours as needed for nausea or vomiting. Patient not taking: Reported on 09/10/2017 02/20/17   Mesner, Barbara Cower, MD    Family History No family history on file.  Social History Social History   Tobacco Use  . Smoking status: Never Smoker  . Smokeless tobacco: Never Used  Substance Use Topics  . Alcohol use: No  . Drug use: No     Allergies   Patient has no known allergies.   Review of Systems Review of Systems  Constitutional: Positive for fatigue. Negative for chills and fever.  HENT: Negative.   Respiratory: Negative for cough and shortness of breath.   Cardiovascular: Negative for chest pain.  Gastrointestinal: Negative for abdominal pain, nausea and vomiting.  Endocrine: Positive for polydipsia and polyuria.  Genitourinary: Positive for frequency. Negative for dysuria.  Musculoskeletal: Negative for myalgias.  Skin: Negative for color change and rash.  All other systems reviewed and are negative.    Physical Exam Updated Vital Signs BP (!) 147/92   Pulse (!) 59   Temp 98.2 F (36.8 C) (Oral)   Resp 17   SpO2 100%   Physical Exam Vitals signs and nursing note reviewed.  Constitutional:      General: He is not in acute  distress.    Appearance: He is well-developed. He is not diaphoretic.     Comments: Well-appearing and in no distress.  HENT:     Head: Normocephalic and atraumatic.     Mouth/Throat:     Mouth: Mucous membranes are moist.     Pharynx: Oropharynx is clear.  Eyes:     General:        Right eye: No discharge.        Left eye: No discharge.     Pupils: Pupils are equal, round, and reactive to light.  Neck:     Musculoskeletal: Neck supple.  Cardiovascular:     Rate and Rhythm: Normal rate and regular rhythm.     Heart sounds: Normal heart sounds. No murmur. No friction rub. No gallop.   Pulmonary:     Effort: Pulmonary effort  is normal. No respiratory distress.     Breath sounds: Normal breath sounds. No wheezing or rales.     Comments: Respirations equal and unlabored, patient able to speak in full sentences, lungs clear to auscultation bilaterally Abdominal:     General: Bowel sounds are normal. There is no distension.     Palpations: Abdomen is soft. There is no mass.     Tenderness: There is no abdominal tenderness. There is no guarding.     Comments: Abdomen soft, nondistended, nontender to palpation in all quadrants without guarding or peritoneal signs  Musculoskeletal:        General: No deformity.  Skin:    General: Skin is warm and dry.     Capillary Refill: Capillary refill takes less than 2 seconds.  Neurological:     Mental Status: He is alert.     Coordination: Coordination normal.     Comments: Speech is clear, able to follow commands Moves extremities without ataxia, coordination intact  Psychiatric:        Mood and Affect: Mood normal.        Behavior: Behavior normal.      ED Treatments / Results  Labs (all labs ordered are listed, but only abnormal results are displayed) Labs Reviewed  BASIC METABOLIC PANEL - Abnormal; Notable for the following components:      Result Value   Sodium 132 (*)    Chloride 93 (*)    Glucose, Bld 388 (*)    All other components within normal limits  CBC - Abnormal; Notable for the following components:   RBC 5.84 (*)    All other components within normal limits  URINALYSIS, ROUTINE W REFLEX MICROSCOPIC - Abnormal; Notable for the following components:   Color, Urine STRAW (*)    Specific Gravity, Urine 1.031 (*)    Glucose, UA >=500 (*)    All other components within normal limits  CBG MONITORING, ED - Abnormal; Notable for the following components:   Glucose-Capillary 370 (*)    All other components within normal limits  CBG MONITORING, ED - Abnormal; Notable for the following components:   Glucose-Capillary 219 (*)    All other components  within normal limits    EKG None  Radiology No results found.  Procedures Procedures (including critical care time)  Medications Ordered in ED Medications  sodium chloride 0.9 % bolus 1,000 mL (0 mLs Intravenous Stopped 03/13/19 1915)  insulin aspart (novoLOG) injection 10 Units (10 Units Subcutaneous Given 03/13/19 1804)     Initial Impression / Assessment and Plan / ED Course  I have reviewed the triage vital signs and the nursing  notes.  Pertinent labs & imaging results that were available during my care of the patient were reviewed by me and considered in my medical decision making (see chart for details).  23 year old male presenting with hyperglycemia, fatigue, polydipsia and polyuria.  Has been out of insulin and metformin for several months, symptoms slowly worsening.  On arrival hypertensive but vitals otherwise normal.  Has not had any associated vomiting or abdominal pain, no fevers or infectious symptoms.  Alert and oriented, no confusion.  Blood sugar on arrival 370 but lab work obtained from triage is overall reassuring, patient with normal CO2 and anion gap, labs do not suggest DKA, no leukocytosis, urinalysis without any ketones present.  Patient given 1 L IV fluids and 10 units of subcu insulin with improvement in blood sugar to 219.  Case was discussed with Burna Mortimer RN with case management who has enrolled patient in program to have 30-day supply of medications covered and ensured [facility].  Patient's prescriptions for insulin, metformin and lisinopril sent to pharmacy.  Patient and mother expressed understanding and agreement with plan.  Discharged home in good condition.  Final Clinical Impressions(s) / ED Diagnoses   Final diagnoses:  Hyperglycemia    ED Discharge Orders         Ordered    insulin aspart protamine - aspart (NOVOLOG MIX 70/30 FLEXPEN) (70-30) 100 UNIT/ML FlexPen  See admin instructions      03/13/19 1859    lisinopril (ZESTRIL) 5 MG tablet  Daily     03/13/19 1859    metFORMIN (GLUCOPHAGE) 850 MG tablet  2 times daily with meals     03/13/19 1859           Dartha Lodge, PA-C 03/14/19 0120    Charlynne Pander, MD 03/14/19 [phone]

## 2019-03-13 NOTE — ED Triage Notes (Signed)
Patient complains of fatigue, polyuria for the past few days. Reports no insulin or oral meds for several months due to price. BS 370 on arrival. Alert and oriented

## 2019-03-14 ENCOUNTER — Telehealth: Payer: Self-pay

## 2019-03-14 NOTE — Telephone Encounter (Signed)
Message received from Laurena Slimmer, RN CM requesting a hospital follow up appointment for patient.  Informed her that patient has appt scheduled for 04/10/2019 @ 1030 @ [place]

## 2019-04-10 ENCOUNTER — Ambulatory Visit: Payer: Self-pay | Attending: Family Medicine | Admitting: Family Medicine

## 2019-04-10 ENCOUNTER — Other Ambulatory Visit: Payer: Self-pay

## 2019-04-10 ENCOUNTER — Encounter: Payer: Self-pay | Admitting: Family Medicine

## 2019-04-10 DIAGNOSIS — Z09 Encounter for follow-up examination after completed treatment for conditions other than malignant neoplasm: Secondary | ICD-10-CM

## 2019-04-10 DIAGNOSIS — E119 Type 2 diabetes mellitus without complications: Secondary | ICD-10-CM

## 2019-04-10 DIAGNOSIS — I10 Essential (primary) hypertension: Secondary | ICD-10-CM

## 2019-04-10 DIAGNOSIS — Z794 Long term (current) use of insulin: Secondary | ICD-10-CM

## 2019-04-10 MED ORDER — NOVOLOG MIX 70/30 FLEXPEN (70-30) 100 UNIT/ML ~~LOC~~ SUPN: 28.0000 [IU] | PEN_INJECTOR | SUBCUTANEOUS | 4 refills | Status: DC

## 2019-04-10 MED ORDER — TRUEPLUS LANCETS 28G MISC: 11 refills | Status: DC

## 2019-04-10 MED ORDER — TRUE METRIX BLOOD GLUCOSE TEST VI STRP: ORAL_STRIP | 12 refills | Status: DC

## 2019-04-10 MED ORDER — TRUE METRIX METER W/DEVICE KIT: PACK | 0 refills | Status: DC

## 2019-04-10 MED ORDER — METFORMIN HCL 850 MG PO TABS: 850.0000 mg | ORAL_TABLET | Freq: Two times a day (BID) | ORAL | 4 refills | Status: DC

## 2019-04-10 MED ORDER — LISINOPRIL 5 MG PO TABS: 5.0000 mg | ORAL_TABLET | Freq: Every day | ORAL | 1 refills | Status: DC

## 2019-04-10 MED FILL — metFORMIN HCL 850 MG TABS: 850 | 30 days supply | Qty: 60 | Fill #0

## 2019-04-10 MED FILL — TRUE METRIX TEST STRIP: 33 days supply | Qty: 100 | Fill #0

## 2019-04-10 MED FILL — LISINOPRIL 5 MG TABLET: 5 | 30 days supply | Qty: 30 | Fill #0

## 2019-04-10 MED FILL — !TRUE METRIX BLOOD GLUCOSE: 1 days supply | Qty: 1 | Fill #0

## 2019-04-10 MED FILL — TRUEplus LANCETS 28G MISC: 33 days supply | Qty: 100 | Fill #0

## 2019-04-10 NOTE — Progress Notes (Signed)
Patient verified DOB Patient has taken medication today. Patient has eaten today. Patient denies pain at this time. Patient needs Novolog sent to [facility] pharmacy, MA has changed pharmacy in the system.

## 2019-04-10 NOTE — Progress Notes (Signed)
Virtual Visit via Telephone Note  I connected with Logan Dunn on 04/10/19 at 10:30 AM EST by telephone and verified that I am speaking with the correct person using two identifiers.   I discussed the limitations, risks, security and privacy concerns of performing an evaluation and management service by telephone and the availability of in person appointments. I also discussed with the patient that there may be a patient responsible charge related to this service. The patient expressed understanding and agreed to proceed.  Patient Location: Geneticist, molecular Location: [facility] Others participating in call: call initiated by [place], CMA then transferred to me   History of Present Illness:       23 year old male new to the practice who is status post ED visit on 03/13/2019 due to hyperglycemia after he ran out of insulin and Metformin and was unable to get the medications refilled due to financial issues.  Per ED, patient had complaint of fatigue, urinary frequency and increased thirst.  Patient had lost Medicaid coverage when he turned 23 and could not afford to refill medicines.  Labs at the ED included glucose of 388 with sodium of 132.  Blood pressure was 147/92.  Heart rate 59.  No ketones on urinalysis.  Patient was discharged on NovoLog FlexPen, lisinopril 5 mg and Metformin 850 twice daily.       He reports that he is currently taking NovoLog 70/30 at 40 units in the morning and 28 units at night.  He does not have a glucometer therefore he is not sure how his blood sugars are running.  He does have continued episodes of increased thirst, urinary frequency and occasional blurred vision.  He denies headaches or dizziness, no sore throat or difficulty swallowing, no abdominal pain-no nausea or vomiting.  No chest pain or palpitations, no shortness of breath or cough.  He denies any loose stools or diarrhea with the use of metformin.  He reports that he was diagnosed with diabetes at the age  of 23.  He is also taking the lisinopril and denies shortness of breath or cough.  He does need refills of his medications.   Past Medical History:  Diagnosis Date   Diabetes (HCC)    Hypertension     Past Surgical History:  Procedure Laterality Date   NO PAST SURGERIES      Family History  Problem Relation Age of Onset   Hypertension Mother    Diabetes Father    Hypertension Father    Cancer Maternal Grandmother    Cancer Maternal Grandfather     Social History   Tobacco Use   Smoking status: Never Smoker   Smokeless tobacco: Never Used  Substance Use Topics   Alcohol use: No   Drug use: No     No Known Allergies     Observations/Objective: No vital signs or physical exam conducted as visit was done via telephone  Assessment and Plan: 1. Type 2 diabetes mellitus without complication, with long-term current use of insulin (HCC) He reports that he is taking Metformin and NovoLog 70/30 but continues to have some issues with increased thirst or urinary frequency but does not have monitoring supplies.  Patient will have refill sent to pharmacy for his current NovoLog 70/30 as well as metformin.  We will send in prescriptions for glucometer, testing supplies and strips.  He has been asked to make an office follow-up in a few weeks so that labs can be obtained, review of glucometer readings and  diabetic foot exam and discuss any other issues related to his diabetes and medical care.  2. Essential hypertension Refill provided of patient's lisinopril 5 mg and at his upcoming visit, will discuss labs and follow-up of hypertension and to look for any signs of renal damage related to patient's diabetes.  Patient will have electrolytes done as well as urine microalbumin.  Follow Up Instructions: Diabetes/hypertension-2 to 3-week follow-up and labs    I discussed the assessment and treatment plan with the patient. The patient was provided an opportunity to ask questions  and all were answered. The patient agreed with the plan and demonstrated an understanding of the instructions.   The patient was advised to call back or seek an in-person evaluation if the symptoms worsen or if the condition fails to improve as anticipated.  I provided 11 minutes of non-face-to-face time during this encounter.   Antony Blackbird, MD

## 2019-05-09 ENCOUNTER — Ambulatory Visit: Payer: Self-pay | Attending: Family Medicine | Admitting: Family Medicine

## 2019-05-09 ENCOUNTER — Other Ambulatory Visit: Payer: Self-pay

## 2019-06-09 ENCOUNTER — Encounter: Payer: Self-pay | Admitting: Family Medicine

## 2019-06-09 DIAGNOSIS — E119 Type 2 diabetes mellitus without complications: Secondary | ICD-10-CM | POA: Insufficient documentation

## 2019-06-09 DIAGNOSIS — I10 Essential (primary) hypertension: Secondary | ICD-10-CM | POA: Insufficient documentation

## 2019-06-09 DIAGNOSIS — Z794 Long term (current) use of insulin: Secondary | ICD-10-CM | POA: Insufficient documentation

## 2021-04-09 ENCOUNTER — Emergency Department (HOSPITAL_COMMUNITY)
Admission: EM | Admit: 2021-04-09 | Discharge: 2021-04-09 | Disposition: A | Discharge status: Home / Self Care | Payer: Self-pay | Attending: Emergency Medicine | Admitting: Emergency Medicine

## 2021-04-09 DIAGNOSIS — R509 Fever, unspecified: Secondary | ICD-10-CM | POA: Insufficient documentation

## 2021-04-09 DIAGNOSIS — B974 Respiratory syncytial virus as the cause of diseases classified elsewhere: Secondary | ICD-10-CM | POA: Insufficient documentation

## 2021-04-09 DIAGNOSIS — M791 Myalgia, unspecified site: Secondary | ICD-10-CM | POA: Insufficient documentation

## 2021-04-09 DIAGNOSIS — Z5321 Procedure and treatment not carried out due to patient leaving prior to being seen by health care provider: Secondary | ICD-10-CM | POA: Insufficient documentation

## 2021-04-09 NOTE — ED Triage Notes (Signed)
Pt transported from home by [facility] for body aches, chills, fever, children in home recently dx with RSV. 158/60, 68, 100%RA, 258, T99.2

## 2021-04-09 NOTE — ED Notes (Signed)
Lanny Hurst aunt [phone] requesting to be called when patient is in a room

## 2021-04-12 ENCOUNTER — Encounter (HOSPITAL_COMMUNITY): Payer: Self-pay | Admitting: Emergency Medicine

## 2022-04-15 ENCOUNTER — Encounter (HOSPITAL_COMMUNITY): Payer: Self-pay

## 2022-04-15 ENCOUNTER — Other Ambulatory Visit: Payer: Self-pay

## 2022-04-15 ENCOUNTER — Emergency Department (HOSPITAL_COMMUNITY)
Admission: EM | Admit: 2022-04-15 | Discharge: 2022-04-15 | Disposition: A | Discharge status: Home / Self Care | Payer: Commercial Managed Care - HMO | Attending: Emergency Medicine | Admitting: Emergency Medicine

## 2022-04-15 DIAGNOSIS — H538 Other visual disturbances: Secondary | ICD-10-CM | POA: Diagnosis present

## 2022-04-15 DIAGNOSIS — I10 Essential (primary) hypertension: Secondary | ICD-10-CM | POA: Insufficient documentation

## 2022-04-15 DIAGNOSIS — Z794 Long term (current) use of insulin: Secondary | ICD-10-CM | POA: Insufficient documentation

## 2022-04-15 DIAGNOSIS — E1165 Type 2 diabetes mellitus with hyperglycemia: Secondary | ICD-10-CM | POA: Diagnosis not present

## 2022-04-15 DIAGNOSIS — R739 Hyperglycemia, unspecified: Secondary | ICD-10-CM

## 2022-04-15 DIAGNOSIS — Z7984 Long term (current) use of oral hypoglycemic drugs: Secondary | ICD-10-CM | POA: Diagnosis not present

## 2022-04-15 DIAGNOSIS — Z79899 Other long term (current) drug therapy: Secondary | ICD-10-CM | POA: Insufficient documentation

## 2022-04-15 LAB — BLOOD GAS, VENOUS
Acid-Base Excess: 3 mmol/L — ABNORMAL HIGH (ref 0.0–2.0)
Bicarbonate: 29 mmol/L — ABNORMAL HIGH (ref 20.0–28.0)
O2 Saturation: 85.9 %
Patient temperature: 37
pCO2, Ven: 49 mmHg (ref 44–60)
pH, Ven: 7.38 (ref 7.25–7.43)
pO2, Ven: 51 mmHg — ABNORMAL HIGH (ref 32–45)

## 2022-04-15 LAB — BASIC METABOLIC PANEL
Anion gap: 7 (ref 5–15)
BUN: 10 mg/dL (ref 6–20)
CO2: 26 mmol/L (ref 22–32)
Calcium: 8.7 mg/dL — ABNORMAL LOW (ref 8.9–10.3)
Chloride: 102 mmol/L (ref 98–111)
Creatinine, Ser: 0.85 mg/dL (ref 0.61–1.24)
GFR, Estimated: >60 mL/min (ref >60)
Glucose, Bld: 281 mg/dL — ABNORMAL HIGH (ref 70–99)
Potassium: 4.2 mmol/L (ref 3.5–5.1)
Sodium: 135 mmol/L (ref 135–145)

## 2022-04-15 LAB — CBC
HCT: 44.7 % (ref 39.0–52.0)
Hemoglobin: 15.3 g/dL (ref 13.0–17.0)
MCH: 28.1 pg (ref 26.0–34.0)
MCHC: 34.2 g/dL (ref 30.0–36.0)
MCV: 82 fL (ref 80.0–100.0)
Platelets: 289 10*3/uL (ref 150–400)
RBC: 5.45 MIL/uL (ref 4.22–5.81)
RDW: 12.6 % (ref 11.5–15.5)
WBC: 7.6 10*3/uL (ref 4.0–10.5)
nRBC: 0 % (ref 0.0–0.2)

## 2022-04-15 LAB — CBG MONITORING, ED: Glucose-Capillary: 273 mg/dL — ABNORMAL HIGH (ref 70–99)

## 2022-04-15 LAB — HEMOGLOBIN A1C
Hgb A1c MFr Bld: 9.3 % — ABNORMAL HIGH (ref 4.8–5.6)
Mean Plasma Glucose: 220.21 mg/dL

## 2022-04-15 MED ORDER — METFORMIN HCL 500 MG PO TABS: 500.0000 mg | ORAL_TABLET | Freq: Two times a day (BID) | ORAL | 0 refills | Status: DC

## 2022-04-15 MED ORDER — LISINOPRIL 10 MG PO TABS: 10.0000 mg | ORAL_TABLET | Freq: Every day | ORAL | 0 refills | Status: DC

## 2022-04-15 NOTE — Progress Notes (Signed)
TOC consulted for PCP needs. Resources attached to AVS.

## 2022-04-15 NOTE — ED Triage Notes (Signed)
Pt reports bilateral hand numbness and blurred vision hx diabetes. Pt states has been off diabetic medication x few years and has been having these symptoms since stopping meds.

## 2022-04-15 NOTE — ED Provider Notes (Signed)
[facility] DEPT Provider Note   CSN: [id number] Arrival date & time: 04/15/22  3845     History  Chief Complaint  Patient presents with   Blurred Vision    Logan Dunn is a 26 y.o. male who reports that he was a former diabetic, on pills and insulin, presenting to the ED with tingling in his fingers and blurred vision.  He does not have a doctor.  He says he has been off of medications for many years.  He said he wanted to come get checked out because he was worried with tingling in his hands.  He has never had any vision exam either.  HPI     Home Medications Prior to Admission medications   Medication Sig Start Date End Date Taking? Authorizing Provider  lisinopril (ZESTRIL) 10 MG tablet Take 1 tablet (10 mg total) by mouth daily. 04/15/22 06/14/22 Yes Kayona Foor, Carola Rhine, MD  metFORMIN (GLUCOPHAGE) 500 MG tablet Take 1 tablet (500 mg total) by mouth 2 (two) times daily with a meal. 04/15/22 06/14/22 Yes Shemica Meath, Carola Rhine, MD  ACCU-CHEK FASTCLIX LANCETS MISC 1 each by Does not apply route 6 (six) times daily. Check sugar 6 x daily 08/27/15   Jonathon Resides T, FNP  acetone, urine, test strip Check ketones per protocol 08/03/15   Levon Hedger, MD  Alcohol Swabs (ALCOHOL PADS) 70 % PADS Use to clean skin prior to injections 2 times daily 08/03/15   Levon Hedger, MD  BD PEN NEEDLE NANO U/F 32G X 4 MM MISC USE AS DIRECTED TO INJECT INSULIN PEN TWICE DAILY 08/31/16   Lelon Huh, MD  Blood Glucose Monitoring Suppl (TRUE METRIX METER) w/Device KIT Use as directed to monitor BS three times per day 04/10/19   Antony Blackbird, MD  glucagon 1 MG injection Inject 84m IM if unconscious, seizing, or unable to eat to correct low blood sugar 08/03/15   JLevon Hedger MD  glucose blood (ACCU-CHEK AVIVA PLUS) test strip Use to check blood sugar up to 10 times daily 08/03/15   JLevon Hedger MD  glucose blood (TRUE METRIX BLOOD GLUCOSE  TEST) test strip Use as instructed to check blood sugars up to 3 times per day 04/10/19   Fulp, Cammie, MD  ibuprofen (ADVIL) 200 MG tablet Take 800 mg by mouth every 6 (six) hours as needed for mild pain or moderate pain.    [provider]  insulin aspart protamine - aspart (NOVOLOG MIX 70/30 FLEXPEN) (70-30) 100 UNIT/ML FlexPen Inject 0.28-0.4 mLs (28-40 Units total) into the skin See admin instructions. 40 units in the morning and 28 units at bedtime 04/10/19   Fulp, Cammie, MD  insulin aspart protamine- aspart (NOVOLOG MIX 70/30) (70-30) 100 UNIT/ML injection Inject 28-40 Units into the skin 2 (two) times daily with a meal. 40 units in the morning and 28 units at night    [provider]  lisinopril (PRINIVIL,ZESTRIL) 5 MG tablet TAKE ONE TABLET BY MOUTH ONCE DAILY 12/05/16   FRogue Bussing MD  lisinopril (ZESTRIL) 5 MG tablet Take 1 tablet (5 mg total) by mouth daily. 04/10/19   Fulp, Cammie, MD  metFORMIN (GLUCOPHAGE) 500 MG tablet Take 1 tablet (500 mg total) by mouth 2 (two) times daily with a meal. 08/03/15   Jessup, AIrven Shelling MD  metFORMIN (GLUCOPHAGE) 850 MG tablet Take 1 tablet (850 mg total) by mouth 2 (two) times daily with a meal. 04/10/19   FAntony Blackbird MD  NOVOLOG MIX 70/30 FLEXPEN (70-30) 100 UNIT/ML FlexPen INJECT AS DIRECTED BY YOUR DOCTOR, UP TO 100 UNITS PER DAY 01/14/16   Jonathon Resides T, FNP  NOVOLOG MIX 70/30 FLEXPEN (70-30) 100 UNIT/ML FlexPen INJECT UP TO 100 UNITS SUBCUTANEOUSLY DAILY AS DIRECTED BY YOUR DOCTOR. MUST HAVE OFFICE VISIT BEFORE FURTHER REFILLS. 04/13/16   Hermenia Bers, NP  NOVOLOG MIX 70/30 FLEXPEN (70-30) 100 UNIT/ML FlexPen INJECT AS DIRECTED BY DOCTOR, UP TO 100 UNITS PER DAY 08/09/16   Levon Hedger, MD  TRUEplus Lancets 28G MISC Use when checking blood sugars up to 3 times daily 04/10/19   Antony Blackbird, MD      Allergies    Patient has no known allergies.    Review of Systems   Review of Systems  Physical  Exam Updated Vital Signs BP 137/87 (BP Location: Right Arm)   Pulse 68   Temp 98.4 F (36.9 C) (Oral)   Resp 18   Ht _0  (1.88 m)   Wt (!) 137 kg   SpO2 96%   BMI 38.78 kg/m  Physical Exam Constitutional:      General: He is not in acute distress. HENT:     Head: Normocephalic and atraumatic.  Eyes:     Conjunctiva/sclera: Conjunctivae normal.     Pupils: Pupils are equal, round, and reactive to light.  Cardiovascular:     Rate and Rhythm: Normal rate and regular rhythm.  Pulmonary:     Effort: Pulmonary effort is normal. No respiratory distress.  Abdominal:     General: There is no distension.     Tenderness: There is no abdominal tenderness.  Skin:    General: Skin is warm and dry.  Neurological:     General: No focal deficit present.     Mental Status: He is alert. Mental status is at baseline.  Psychiatric:        Mood and Affect: Mood normal.        Behavior: Behavior normal.     ED Results / Procedures / Treatments   Labs (all labs ordered are listed, but only abnormal results are displayed) Labs Reviewed  BASIC METABOLIC PANEL - Abnormal; Notable for the following components:      Result Value   Glucose, Bld 281 (*)    Calcium 8.7 (*)    All other components within normal limits  HEMOGLOBIN A1C - Abnormal; Notable for the following components:   Hgb A1c MFr Bld 9.3 (*)    All other components within normal limits  BLOOD GAS, VENOUS - Abnormal; Notable for the following components:   pO2, Ven 51 (*)    Bicarbonate 29.0 (*)    Acid-Base Excess 3.0 (*)    All other components within normal limits  CBG MONITORING, ED - Abnormal; Notable for the following components:   Glucose-Capillary 273 (*)    All other components within normal limits  CBC  URINALYSIS, ROUTINE W REFLEX MICROSCOPIC    EKG None  Radiology No results found.  Procedures Procedures    Medications Ordered in ED Medications - No data to display  ED Course/ Medical Decision  Making/ A&P                           Medical Decision Making Amount and/or Complexity of Data Reviewed Labs: ordered.   Patient is here with suspected diabetic neuropathy or metabolic derangement or symptomatic hyperglycemia, which can be causing paresthesias.  Blurred vision may  be multifactorial, and I recommend that he have an actual optometrist visual exam, but I have a low suspicion for retinal detachment or surgical emergency involving the high, or stroke in the brain.  I personally reviewed interpret his labs.  His A1c level is 9.3.  He has some mild hyperglycemia but no anion gap.  No evidence of diabetic ketoacidosis.  No evidence of infection.  I will place a social work consult to see if he can assist with getting him a new primary care clinic, who can better manage his high blood pressure and diabetes.  In the meantime we will start him on metformin 500 mg twice daily, but explained to him that this may not be enough to control his blood sugars.  I provided educational information regarding dietary changes and a diabetic diet.  I will also start him on low-dose amlodipine for his high blood pressure which I suspect is untreated.  He verbalized understanding        Final Clinical Impression(s) / ED Diagnoses Final diagnoses:  Hypertension, unspecified type  Hyperglycemia  Blurred vision    Rx / DC Orders ED Discharge Orders          Ordered    metFORMIN (GLUCOPHAGE) 500 MG tablet  2 times daily with meals        04/15/22 1329    lisinopril (ZESTRIL) 10 MG tablet  Daily        04/15/22 1329              Wyvonnia Dusky, MD 04/15/22 1423

## 2022-04-15 NOTE — Discharge Instructions (Signed)
I started you on lisinopril which is a blood pressure medicine for high blood pressure, as well as metformin for diabetes.  I placed a referral to our social worker to see if we can help you with a new primary care provider appointment.  If you do not hear from them by Monday you should be calling to look for a new primary care doctor in the area.  You need a long-term provider who can help manage your blood sugar, diabetes, blood pressure, and other medical problems.  This should not be managed through the emergency department.  I also recommend that you schedule a follow-up appointment with an optometrist for your blurred vision to have your eyes looked.

## 2022-04-15 NOTE — ED Provider Triage Note (Signed)
Emergency Medicine Provider Triage Evaluation Note  Logan Dunn , a 26 y.o. male  was evaluated in triage.  Pt complains of blurred vision, finger tingling for several weeks.  Says he was a diabetic since age 52 but has been out of his insulin for "years."  Does not see a doctor.  Does not take any other meds.  Review of Systems  Positive: Blurred vision, finger tingling Negative: Fevers, chills, vomiting  Physical Exam  BP (!) 154/113 (BP Location: Right Arm)   Pulse 66   Temp 98.2 F (36.8 C) (Oral)   Resp 18   Ht 6\' 2"  (1.88 m)   Wt (!) 137 kg   SpO2 96%   BMI 38.78 kg/m  Gen:   Awake, no distress   Resp:  Normal effort  MSK:   Moves extremities without difficulty  Other:  N/a  Medical Decision Making  Medically screening exam initiated at 9:21 AM.  Appropriate orders placed.  Maxon Cattell was informed that the remainder of the evaluation will be completed by another provider, this initial triage assessment does not replace that evaluation, and the importance of remaining in the ED until their evaluation is complete.  Workup for potential diabetic neuropathy/diabetic complications Doubt DKA per presentation BS 273 in triage. Ha1c and BMP and UA labs ordered   Laural Benes, MD 04/15/22 [phone]

## 2022-07-01 ENCOUNTER — Ambulatory Visit
Admission: EM | Admit: 2022-07-01 | Discharge: 2022-07-01 | Disposition: A | Discharge status: Home / Self Care | Payer: Commercial Managed Care - HMO | Attending: Internal Medicine | Admitting: Internal Medicine

## 2022-07-01 DIAGNOSIS — L03011 Cellulitis of right finger: Secondary | ICD-10-CM

## 2022-07-01 MED ORDER — CEPHALEXIN 500 MG PO CAPS: 500.0000 mg | ORAL_CAPSULE | Freq: Four times a day (QID) | ORAL | 0 refills | Status: AC

## 2022-07-01 NOTE — Discharge Instructions (Signed)
You have a paronychia which is an infection of the skin surrounding the fingernail.  Please soak in warm Epsom salt soaks 2-3 times a day for about 20 minutes at a time.  I have also prescribed an antibiotic.  Please follow-up if any symptoms persist or worsen.

## 2022-07-01 NOTE — ED Provider Notes (Addendum)
[name]    CSN: [id number] Arrival date & time: 07/01/22  1434      History   Chief Complaint Chief Complaint  Patient presents with   finger problem    X1 day Swelling of right middle finger    HPI Logan Dunn is a 27 y.o. male.   Patient presents with pain and swelling at the distal end of the right middle finger surrounding the nail.  Denies any injury to the area.  Denies any purulent drainage.  Denies any associated fever.  Patient reports it started off as a hangnail that appears that it could have become infected.     Past Medical History:  Diagnosis Date   Diabetes ([place])    Diabetes mellitus without complication ([place])    Hypertension     Patient Active Problem List   Diagnosis Date Noted   Type 2 diabetes mellitus without complication, with long-term current use of insulin ([place]) 06/09/2019   Essential hypertension 06/09/2019   HTN (hypertension) 08/13/2015   Cognitive developmental delay    Newly diagnosed diabetes ([place])    DKA (diabetic ketoacidoses) 07/30/2015    Past Surgical History:  Procedure Laterality Date   NO PAST SURGERIES         Home Medications    Prior to Admission medications   Medication Sig Start Date End Date Taking? Authorizing Provider  ACCU-CHEK FASTCLIX LANCETS MISC 1 each by Does not apply route 6 (six) times daily. Check sugar 6 x daily 08/27/15  Yes Jonathon Resides T, FNP  acetone, urine, test strip Check ketones per protocol 08/03/15  Yes Levon Hedger, MD  Alcohol Swabs (ALCOHOL PADS) 70 % PADS Use to clean skin prior to injections 2 times daily 08/03/15  Yes Jessup, Irven Shelling, MD  BD PEN NEEDLE NANO U/F 32G X 4 MM MISC USE AS DIRECTED TO INJECT INSULIN PEN TWICE DAILY 08/31/16  Yes Lelon Huh, MD  Blood Glucose Monitoring Suppl (TRUE METRIX METER) w/Device KIT Use as directed to monitor BS three times per day 04/10/19  Yes Fulp, Cammie, MD  cephALEXin (KEFLEX) 500 MG capsule Take 1  capsule (500 mg total) by mouth 4 (four) times daily for 5 days. 07/01/22 07/06/22 Yes Teodora Medici, FNP  glucagon 1 MG injection Inject 1mg  IM if unconscious, seizing, or unable to eat to correct low blood sugar 08/03/15  Yes Levon Hedger, MD  glucose blood (ACCU-CHEK AVIVA PLUS) test strip Use to check blood sugar up to 10 times daily 08/03/15  Yes Levon Hedger, MD  glucose blood (TRUE METRIX BLOOD GLUCOSE TEST) test strip Use as instructed to check blood sugars up to 3 times per day 04/10/19  Yes Fulp, Cammie, MD  ibuprofen (ADVIL) 200 MG tablet Take 800 mg by mouth every 6 (six) hours as needed for mild pain or moderate pain.   Yes [provider]  insulin aspart protamine - aspart (NOVOLOG MIX 70/30 FLEXPEN) (70-30) 100 UNIT/ML FlexPen Inject 0.28-0.4 mLs (28-40 Units total) into the skin See admin instructions. 40 units in the morning and 28 units at bedtime 04/10/19  Yes Fulp, Cammie, MD  insulin aspart protamine- aspart (NOVOLOG MIX 70/30) (70-30) 100 UNIT/ML injection Inject 28-40 Units into the skin 2 (two) times daily with a meal. 40 units in the morning and 28 units at night   Yes [provider]  lisinopril (PRINIVIL,ZESTRIL) 5 MG tablet TAKE ONE TABLET BY MOUTH ONCE DAILY 12/05/16  Yes Rogue Bussing, MD  lisinopril (ZESTRIL) 5 MG tablet Take 1 tablet (5 mg total) by mouth daily. 04/10/19  Yes Fulp, Cammie, MD  metFORMIN (GLUCOPHAGE) 500 MG tablet Take 1 tablet (500 mg total) by mouth 2 (two) times daily with a meal. 08/03/15  Yes Casimiro Needle, MD  metFORMIN (GLUCOPHAGE) 850 MG tablet Take 1 tablet (850 mg total) by mouth 2 (two) times daily with a meal. 04/10/19  Yes Fulp, Cammie, MD  NOVOLOG MIX 70/30 FLEXPEN (70-30) 100 UNIT/ML FlexPen INJECT AS DIRECTED BY YOUR DOCTOR, UP TO 100 UNITS PER DAY 01/14/16  Yes Hacker, Rayfield Citizen T, FNP  NOVOLOG MIX 70/30 FLEXPEN (70-30) 100 UNIT/ML FlexPen INJECT UP TO 100 UNITS SUBCUTANEOUSLY DAILY AS  DIRECTED BY YOUR DOCTOR. MUST HAVE OFFICE VISIT BEFORE FURTHER REFILLS. 04/13/16  Yes Beasley, Spenser, NP  NOVOLOG MIX 70/30 FLEXPEN (70-30) 100 UNIT/ML FlexPen INJECT AS DIRECTED BY DOCTOR, UP TO 100 UNITS PER DAY 08/09/16  Yes Casimiro Needle, MD  TRUEplus Lancets 28G MISC Use when checking blood sugars up to 3 times daily 04/10/19  Yes Fulp, Cammie, MD  lisinopril (ZESTRIL) 10 MG tablet Take 1 tablet (10 mg total) by mouth daily. 04/15/22 06/14/22  Terald Sleeper, MD  metFORMIN (GLUCOPHAGE) 500 MG tablet Take 1 tablet (500 mg total) by mouth 2 (two) times daily with a meal. 04/15/22 06/14/22  Trifan, Kermit Balo, MD    Family History Family History  Problem Relation Age of Onset   Hypertension Mother    Diabetes Father    Hypertension Father    Cancer Maternal Grandmother    Cancer Maternal Grandfather    Heart disease Mother    Diabetes Maternal Grandmother    Heart disease Maternal Grandmother     Social History Social History   Tobacco Use   Smoking status: Never   Smokeless tobacco: Never  Substance Use Topics   Alcohol use: No   Drug use: No     Allergies   Patient has no known allergies.   Review of Systems Review of Systems Per HPI  Physical Exam Triage Vital Signs ED Triage Vitals  Enc Vitals Group     BP 07/01/22 1712 (!) 143/94     Pulse Rate 07/01/22 1712 72     Resp 07/01/22 1712 18     Temp 07/01/22 1712 97.9 F (36.6 C)     Temp Source 07/01/22 1712 Oral     SpO2 07/01/22 1712 95 %     Weight 07/01/22 1710 200 lb (90.7 kg)     Height 07/01/22 1710 6\' 2"  (1.88 m)     Head Circumference --      Peak Flow --      Pain Score 07/01/22 1710 10     Pain Loc --      Pain Edu? --      Excl. in GC? --    No data found.  Updated Vital Signs BP (!) 143/94 (BP Location: Left Arm)   Pulse 72   Temp 97.9 F (36.6 C) (Oral)   Resp 18   Ht 6\' 2"  (1.88 m)   Wt 200 lb (90.7 kg)   SpO2 95%   BMI 25.68 kg/m   Visual Acuity Right Eye Distance:    Left Eye Distance:   Bilateral Distance:    Right Eye Near:   Left Eye Near:    Bilateral Near:     Physical Exam Constitutional:      General: He is not in acute distress.  Appearance: Normal appearance. He is not toxic-appearing or diaphoretic.  HENT:     Head: Normocephalic and atraumatic.  Eyes:     Extraocular Movements: Extraocular movements intact.     Conjunctiva/sclera: Conjunctivae normal.  Pulmonary:     Effort: Pulmonary effort is normal.  Musculoskeletal:     Comments: Erythema, swelling, area of fluctuance present to distal end of right middle finger surrounding cuticle of nail.  No purulent drainage noted.  Capillary refill and pulses intact.  Patient has full range of motion of finger.  Neurological:     General: No focal deficit present.     Mental Status: He is alert and oriented to person, place, and time. Mental status is at baseline.  Psychiatric:        Mood and Affect: Mood normal.        Behavior: Behavior normal.        Thought Content: Thought content normal.        Judgment: Judgment normal.      UC Treatments / Results  Labs (all labs ordered are listed, but only abnormal results are displayed) Labs Reviewed - No data to display  EKG   Radiology No results found.  Procedures Procedures (including critical care time)  Medications Ordered in UC Medications - No data to display  Initial Impression / Assessment and Plan / UC Course  I have reviewed the triage vital signs and the nursing notes.  Pertinent labs & imaging results that were available during my care of the patient were reviewed by me and considered in my medical decision making (see chart for details).     Mild paronychia present on physical exam.  18-gauge needle used to drain small amount of purulent drainage.  Will treat with warm Epsom salt soaks and cephalexin antibiotic.  Patient takes metformin so patient was advised to monitor blood sugar more closely while on  metformin.  Creatinine clearance appears normal so no dosage adjustments necessary.  He states that he does have a glucose monitor at home.  Patient was advised to follow-up if symptoms persist or worsen.  Patient verbalized understanding and was agreeable with plan. Final Clinical Impressions(s) / UC Diagnoses   Final diagnoses:  Paronychia of finger, right     Discharge Instructions      You have a paronychia which is an infection of the skin surrounding the fingernail.  Please soak in warm Epsom salt soaks 2-3 times a day for about 20 minutes at a time.  I have also prescribed an antibiotic.  Please follow-up if any symptoms persist or worsen.    ED Prescriptions     Medication Sig Dispense Auth. Provider   cephALEXin (KEFLEX) 500 MG capsule Take 1 capsule (500 mg total) by mouth 4 (four) times daily for 5 days. 20 capsule Teodora Medici, [place]      PDMP not reviewed this encounter.   Teodora Medici, [place] 07/01/22 [place], [place], [place] 07/01/22 [phone]

## 2022-07-01 NOTE — ED Triage Notes (Signed)
Pt states that he has some swelling of his right middle finger. X1 week  No known injury

## 2022-08-26 ENCOUNTER — Other Ambulatory Visit: Payer: Self-pay

## 2022-08-26 ENCOUNTER — Emergency Department (HOSPITAL_COMMUNITY)
Admission: EM | Admit: 2022-08-26 | Discharge: 2022-08-26 | Disposition: A | Discharge status: Home / Self Care | Payer: BLUE CROSS/BLUE SHIELD | Attending: Emergency Medicine | Admitting: Emergency Medicine

## 2022-08-26 ENCOUNTER — Emergency Department (HOSPITAL_COMMUNITY): Payer: BLUE CROSS/BLUE SHIELD

## 2022-08-26 DIAGNOSIS — Z794 Long term (current) use of insulin: Secondary | ICD-10-CM | POA: Diagnosis not present

## 2022-08-26 DIAGNOSIS — Z79899 Other long term (current) drug therapy: Secondary | ICD-10-CM | POA: Diagnosis not present

## 2022-08-26 DIAGNOSIS — M25572 Pain in left ankle and joints of left foot: Secondary | ICD-10-CM | POA: Insufficient documentation

## 2022-08-26 DIAGNOSIS — Z7984 Long term (current) use of oral hypoglycemic drugs: Secondary | ICD-10-CM | POA: Insufficient documentation

## 2022-08-26 DIAGNOSIS — I10 Essential (primary) hypertension: Secondary | ICD-10-CM | POA: Insufficient documentation

## 2022-08-26 DIAGNOSIS — E119 Type 2 diabetes mellitus without complications: Secondary | ICD-10-CM | POA: Insufficient documentation

## 2022-08-26 MED ORDER — ACETAMINOPHEN 325 MG PO TABS
650.0000 mg | ORAL_TABLET | Freq: Once | ORAL | Status: AC
  Administered 2022-08-26: 650 mg via ORAL
  Filled 2022-08-26: qty 2

## 2022-08-26 MED ORDER — IBUPROFEN 200 MG PO TABS
600.0000 mg | ORAL_TABLET | Freq: Once | ORAL | Status: AC
  Administered 2022-08-26: 600 mg via ORAL
  Filled 2022-08-26: qty 3
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
## 2022-08-26 NOTE — Discharge Instructions (Signed)
You were seen in the emergency department today for having a ramp your left ankle.  There is no fracture to your ankle or foot.  Please continue to use ice intermittently at home over the next week.  You can use ibuprofen and Tylenol to help with pain.  Please elevate your leg when you are resting.  Please return to the emergency department if you are unable to walk.

## 2022-09-29 ENCOUNTER — Encounter: Payer: Self-pay | Admitting: Family Medicine

## 2022-09-29 ENCOUNTER — Ambulatory Visit (INDEPENDENT_AMBULATORY_CARE_PROVIDER_SITE_OTHER): Payer: BLUE CROSS/BLUE SHIELD | Admitting: Family Medicine

## 2022-09-29 VITALS — BP 132/93 | HR 85 | Temp 98.1°F | Resp 16 | Wt 250.0 lb

## 2022-09-29 DIAGNOSIS — Z7689 Persons encountering health services in other specified circumstances: Secondary | ICD-10-CM | POA: Diagnosis not present

## 2022-09-29 DIAGNOSIS — Z794 Long term (current) use of insulin: Secondary | ICD-10-CM

## 2022-09-29 DIAGNOSIS — E119 Type 2 diabetes mellitus without complications: Secondary | ICD-10-CM | POA: Diagnosis not present

## 2022-09-29 DIAGNOSIS — I10 Essential (primary) hypertension: Secondary | ICD-10-CM | POA: Diagnosis not present

## 2022-09-29 LAB — POCT GLYCOSYLATED HEMOGLOBIN (HGB A1C): Hemoglobin A1C: 10.1 % — AB (ref 4.0–5.6)

## 2022-09-29 MED ORDER — LISINOPRIL 10 MG PO TABS: 10.0000 mg | ORAL_TABLET | Freq: Every day | ORAL | 1 refills | Status: DC

## 2022-09-29 MED ORDER — NOVOLOG MIX 70/30 FLEXPEN (70-30) 100 UNIT/ML ~~LOC~~ SUPN: 28.0000 [IU] | PEN_INJECTOR | SUBCUTANEOUS | 4 refills | Status: DC

## 2022-09-29 MED ORDER — BD PEN NEEDLE NANO U/F 32G X 4 MM MISC: 7 refills | Status: DC

## 2022-09-30 LAB — MICROALBUMIN / CREATININE URINE RATIO
Creatinine, Urine: 260.3 mg/dL
Microalb/Creat Ratio: 372 mg/g creat — ABNORMAL HIGH (ref 0–29)
Microalbumin, Urine: 969 ug/mL

## 2022-10-04 NOTE — Progress Notes (Signed)
New Patient Office Visit  Subjective    Patient ID: Logan Dunn, male    DOB: 03-08-1996  Age: 27 y.o. MRN: [id number]  CC:  Chief Complaint  Patient presents with   Establish Care    HPI [name] presents to establish care and for review of chronic med issues. Patient denies acute complaints or concerns.    Outpatient Encounter Medications as of 09/29/2022  Medication Sig   ACCU-CHEK FASTCLIX LANCETS MISC 1 each by Does not apply route 6 (six) times daily. Check sugar 6 x daily   acetone, urine, test strip Check ketones per protocol   Alcohol Swabs (ALCOHOL PADS) 70 % PADS Use to clean skin prior to injections 2 times daily   Blood Glucose Monitoring Suppl (TRUE METRIX METER) w/Device KIT Use as directed to monitor BS three times per day   glucagon 1 MG injection Inject 1mg  IM if unconscious, seizing, or unable to eat to correct low blood sugar   glucose blood (ACCU-CHEK AVIVA PLUS) test strip Use to check blood sugar up to 10 times daily   glucose blood (TRUE METRIX BLOOD GLUCOSE TEST) test strip Use as instructed to check blood sugars up to 3 times per day   ibuprofen (ADVIL) 200 MG tablet Take 800 mg by mouth every 6 (six) hours as needed for mild pain or moderate pain.   insulin aspart protamine - aspart (NOVOLOG MIX 70/30 FLEXPEN) (70-30) 100 UNIT/ML FlexPen Inject 28-40 Units into the skin See admin instructions. 40 units in the morning and 28 units at bedtime   insulin aspart protamine- aspart (NOVOLOG MIX 70/30) (70-30) 100 UNIT/ML injection Inject 28-40 Units into the skin 2 (two) times daily with a meal. 40 units in the morning and 28 units at night   Insulin Pen Needle (BD PEN NEEDLE NANO U/F) 32G X 4 MM MISC USE AS DIRECTED TO INJECT INSULIN PEN TWICE DAILY   lisinopril (ZESTRIL) 10 MG tablet Take 1 tablet (10 mg total) by mouth daily.   metFORMIN (GLUCOPHAGE) 500 MG tablet Take 1 tablet (500 mg total) by mouth 2 (two) times daily with a meal.   metFORMIN  (GLUCOPHAGE) 500 MG tablet Take 1 tablet (500 mg total) by mouth 2 (two) times daily with a meal.   metFORMIN (GLUCOPHAGE) 850 MG tablet Take 1 tablet (850 mg total) by mouth 2 (two) times daily with a meal.   NOVOLOG MIX 70/30 FLEXPEN (70-30) 100 UNIT/ML FlexPen INJECT AS DIRECTED BY YOUR DOCTOR, UP TO 100 UNITS PER DAY   NOVOLOG MIX 70/30 FLEXPEN (70-30) 100 UNIT/ML FlexPen INJECT UP TO 100 UNITS SUBCUTANEOUSLY DAILY AS DIRECTED BY YOUR DOCTOR. MUST HAVE OFFICE VISIT BEFORE FURTHER REFILLS.   NOVOLOG MIX 70/30 FLEXPEN (70-30) 100 UNIT/ML FlexPen INJECT AS DIRECTED BY DOCTOR, UP TO 100 UNITS PER DAY   TRUEplus Lancets 28G MISC Use when checking blood sugars up to 3 times daily   [DISCONTINUED] BD PEN NEEDLE NANO U/F 32G X 4 MM MISC USE AS DIRECTED TO INJECT INSULIN PEN TWICE DAILY   [DISCONTINUED] insulin aspart protamine - aspart (NOVOLOG MIX 70/30 FLEXPEN) (70-30) 100 UNIT/ML FlexPen Inject 0.28-0.4 mLs (28-40 Units total) into the skin See admin instructions. 40 units in the morning and 28 units at bedtime   [DISCONTINUED] lisinopril (PRINIVIL,ZESTRIL) 5 MG tablet TAKE ONE TABLET BY MOUTH ONCE DAILY   [DISCONTINUED] lisinopril (ZESTRIL) 10 MG tablet Take 1 tablet (10 mg total) by mouth daily.   [DISCONTINUED] lisinopril (ZESTRIL) 5 MG tablet Take  1 tablet (5 mg total) by mouth daily.   No facility-administered encounter medications on file as of 09/29/2022.    Past Medical History:  Diagnosis Date   Diabetes (HCC)    Diabetes mellitus without complication (HCC)    Hypertension     Past Surgical History:  Procedure Laterality Date   NO PAST SURGERIES      Family History  Problem Relation Age of Onset   Hypertension Mother    Diabetes Father    Hypertension Father    Cancer Maternal Grandmother    Cancer Maternal Grandfather    Heart disease Mother    Diabetes Maternal Grandmother    Heart disease Maternal Grandmother     Social History   Socioeconomic History   Marital  status: Single    Spouse name: Not on file   Number of children: Not on file   Years of education: Not on file   Highest education level: 11th grade  Occupational History   Not on file  Tobacco Use   Smoking status: Never   Smokeless tobacco: Never  Vaping Use   Vaping Use: Never used  Substance and Sexual Activity   Alcohol use: No   Drug use: No   Sexual activity: Not Currently    Birth control/protection: Condom  Other Topics Concern   Not on file  Social History Narrative   ** Merged History Encounter **       Social Determinants of Health   Financial Resource Strain: High Risk (09/29/2022)   Overall Financial Resource Strain (CARDIA)    Difficulty of Paying Living Expenses: Very hard  Food Insecurity: Food Insecurity Present (09/29/2022)   Hunger Vital Sign    Worried About Running Out of Food in the Last Year: Often true    Ran Out of Food in the Last Year: Often true  Transportation Needs: No Transportation Needs (09/29/2022)   PRAPARE - Administrator, Civil Service (Medical): No    Lack of Transportation (Non-Medical): No  Physical Activity: Sufficiently Active (09/29/2022)   Exercise Vital Sign    Days of Exercise per Week: 5 days    Minutes of Exercise per Session: 60 min  Stress: Patient Declined (09/29/2022)   [employer] of Occupational Health - Occupational Stress Questionnaire    Feeling of Stress : Patient declined  Social Connections: Unknown (09/29/2022)   Social Connection and Isolation Panel [NHANES]    Frequency of Communication with Friends and Family: More than three times a week    Frequency of Social Gatherings with Friends and Family: More than three times a week    Attends Religious Services: Patient declined    Database administrator or Organizations: No    Attends Engineer, structural: Not on file    Marital Status: Never married  Intimate Partner Violence: Not on file    Review of Systems  All other systems  reviewed and are negative.       Objective    BP (!) 132/93   Pulse 85   Temp 98.1 F (36.7 C) (Oral)   Resp 16   Wt 250 lb (113.4 kg)   SpO2 95%   BMI 32.10 kg/m   Physical Exam Vitals and nursing note reviewed.  Constitutional:      General: He is not in acute distress. Cardiovascular:     Rate and Rhythm: Normal rate and regular rhythm.  Pulmonary:     Effort: Pulmonary effort is normal.  Breath sounds: Normal breath sounds.  Abdominal:     Palpations: Abdomen is soft.     Tenderness: There is no abdominal tenderness.  Musculoskeletal:        General: Deformity: .diagmed.  Neurological:     General: No focal deficit present.     Mental Status: He is alert and oriented to person, place, and time.         Assessment & Plan:   1. Type 2 diabetes mellitus without complication, with long-term current use of insulin (HCC) Elevated A1c and not at goal. Discussed compliance. Referral for diabetic med management and diabetic education.  - Microalbumin / creatinine urine ratio - POCT glycosylated hemoglobin (Hb A1C) - HM DIABETES FOOT EXAM - insulin aspart protamine - aspart (NOVOLOG MIX 70/30 FLEXPEN) (70-30) 100 UNIT/ML FlexPen; Inject 28-40 Units into the skin See admin instructions. 40 units in the morning and 28 units at bedtime  Dispense: 30 mL; Refill: 4 - Ambulatory referral to diabetic education  2. Essential hypertension Slightly elevated reading.   3. Encounter to establish care    Return in about 2 months (around 11/29/2022) for physical.   Tommie Raymond, MD

## 2022-10-06 ENCOUNTER — Encounter: Payer: Self-pay | Admitting: Pharmacist

## 2022-10-06 ENCOUNTER — Other Ambulatory Visit: Payer: Self-pay

## 2022-10-06 ENCOUNTER — Ambulatory Visit: Payer: BLUE CROSS/BLUE SHIELD | Attending: Family Medicine | Admitting: Pharmacist

## 2022-10-06 DIAGNOSIS — E119 Type 2 diabetes mellitus without complications: Secondary | ICD-10-CM

## 2022-10-06 DIAGNOSIS — Z794 Long term (current) use of insulin: Secondary | ICD-10-CM | POA: Diagnosis not present

## 2022-10-06 MED ORDER — INSULIN PEN NEEDLE 32G X 4 MM MISC
2 refills | Status: DC
  Filled 2022-10-06: qty 100, 90d supply, fill #0
  Filled 2023-06-22: qty 100, 34d supply, fill #1

## 2022-10-06 MED ORDER — LISINOPRIL 10 MG PO TABS
10.0000 mg | ORAL_TABLET | Freq: Every day | ORAL | 1 refills | Status: DC
  Filled 2022-10-06 – 2022-10-22 (×2): qty 90, 90d supply, fill #0

## 2022-10-06 MED ORDER — FREESTYLE LIBRE 2 READER DEVI
0 refills | Status: DC
  Filled 2022-10-06: qty 1, 30d supply, fill #0

## 2022-10-06 MED ORDER — METFORMIN HCL 500 MG PO TABS
500.0000 mg | ORAL_TABLET | Freq: Two times a day (BID) | ORAL | 0 refills | Status: DC
  Filled 2022-10-06: qty 180, 90d supply, fill #0

## 2022-10-06 MED ORDER — ACCU-CHEK SOFTCLIX LANCETS MISC
6 refills | Status: AC
  Filled 2022-10-06: qty 100, 33d supply, fill #0

## 2022-10-06 MED ORDER — ACCU-CHEK GUIDE ME W/DEVICE KIT
PACK | 0 refills | Status: AC
  Filled 2022-10-06: qty 1, 30d supply, fill #0

## 2022-10-06 MED ORDER — FREESTYLE LIBRE 2 SENSOR MISC
6 refills | Status: DC
  Filled 2022-10-06: qty 2, 28d supply, fill #0

## 2022-10-06 MED ORDER — LANTUS SOLOSTAR 100 UNIT/ML ~~LOC~~ SOPN
14.0000 [IU] | PEN_INJECTOR | Freq: Every day | SUBCUTANEOUS | 1 refills | Status: DC
  Filled 2022-10-06: qty 3, 21d supply, fill #0
  Filled 2022-10-22: qty 3, 21d supply, fill #1

## 2022-10-06 MED ORDER — ACCU-CHEK GUIDE VI STRP
ORAL_STRIP | 6 refills | Status: AC
  Filled 2022-10-06: qty 100, 33d supply, fill #0

## 2022-10-06 NOTE — Progress Notes (Signed)
S:     No chief complaint on file.  27 y.o. male who presents for diabetes evaluation, education, and management.  PMH is significant for T2DM.  Patient was referred and last seen by Primary Care Provider, Dr. Andrey Campanile, on 09/29/2022. A1c at that visit was 10.1%. He was referred to me for further management.   Today, patient arrives in good spirits and presents with his mother. Patient reports Diabetes was diagnosed in 2017. He presented with DKA with no prior hx of DM. Age at the time was 27 YO. There was some thought of T1 vs T2 DM. Tests were negative for insulin antibodies at the time. GAD and anti-islet cell antibody tests were negative. C peptide revealed some degree of insulin production. He was placed on insulin.   Since then, he has gone without insulin for some time d/t gaps in insurance coverage. He now has full Medicaid. No clinical ASCVD, CKD, or CHF. No thyroid cancer. No pancreatitis hx. He is only taking metformin BID at this time and is not checking his blood sugar at home.   Family/Social History:  -Fhx: HTN, heart disease, DM -Tobacco: never smoker  -Alcohol: none reported   Current diabetes medications include: metformin 500 mg BID, pt is not taking insulin Current hypertension medications include: lisinopril 10 mg daily Current hyperlipidemia medications include: none  Patient reports adherence to taking metformin and lisinopril.   Insurance coverage: [place] Medicaid   Patient denies hypoglycemic events.  Reported home fasting blood sugars: not checking   Reported 2 hour post-meal/random blood sugars: not checking  Patient reports nocturia (nighttime urination).  Patient reports neuropathy (nerve pain). Patient reports visual changes. Patient reports self foot exams.   Patient reported dietary habits: Eats 3 meals/day Breakfast: cereal w/ milk Lunch: usually something quick like fast food  Dinner: usually brings something home from work like Midwife:  tries to limit sweets  Drinks: water, regular sodas (3-4 sodas a day), Lemonade  Patient-reported exercise habits:  -Works 7 days a week for ~ 6 hours. Tells me he is very active at work.  O:  Lab Results  Component Value Date   HGBA1C 10.1 (A) 09/29/2022   There were no vitals filed for this visit.  Lipid Panel  No results found for: "CHOL", "TRIG", "HDL", "CHOLHDL", "VLDL", "LDLCALC", "LDLDIRECT"  Clinical Atherosclerotic Cardiovascular Disease (ASCVD): No  The ASCVD Risk score (Arnett DK, et al., 2019) failed to calculate for the following reasons:   The 2019 ASCVD risk score is only valid for ages 25 to 41   A/P: Diabetes longstanding currently uncontrolled. Patient is able to verbalize appropriate hypoglycemia management plan. Medication adherence appears appropriate for what he has access to. I will get him established with [place]. We will start basal insulin and have him start checking sugars at home for starters. I anticipate the need for bolus insulin but we will approach this in stages. Questionable as to what type. Tests were negative when evaluating for T1 in 2017. I think we can keep metformin on for now.  -Started Lantus 14u daily.  -Continue metformin 500 mg BID.   -Will consider meal time insulin at follow-up. -CGM supplies and Accu Chek Guide supplies sent.  -Patient educated on purpose, proper use, and potential adverse effects of Lantus.  -Extensively discussed pathophysiology of diabetes, recommended lifestyle interventions, dietary effects on blood sugar control.  -Counseled on s/sx of and management of hypoglycemia.  -Next A1c anticipated 12/2022.   Written patient instructions provided. Patient  verbalized understanding of treatment plan.  Total time in face to face counseling 30 minutes.    Follow-up:  Pharmacist in 1 month.  Butch Penny, PharmD, Patsy [place], CPP Clinical Pharmacist [facility] [phone]

## 2022-10-07 ENCOUNTER — Other Ambulatory Visit: Payer: Self-pay | Admitting: Family Medicine

## 2022-10-07 MED ORDER — GABAPENTIN 300 MG PO CAPS: 300.0000 mg | ORAL_CAPSULE | Freq: Every day | ORAL | 0 refills | Status: DC

## 2022-10-13 ENCOUNTER — Ambulatory Visit: Payer: Self-pay | Admitting: Family Medicine

## 2022-10-24 ENCOUNTER — Other Ambulatory Visit: Payer: Self-pay

## 2022-10-24 ENCOUNTER — Other Ambulatory Visit: Payer: Self-pay | Admitting: Pharmacist

## 2022-10-24 MED ORDER — INSULIN GLARGINE-YFGN 100 UNIT/ML ~~LOC~~ SOPN
14.0000 [IU] | PEN_INJECTOR | Freq: Every day | SUBCUTANEOUS | 2 refills | Status: DC
  Filled 2022-10-24: qty 6, 42d supply, fill #0
  Filled 2023-01-09: qty 6, 42d supply, fill #1
  Filled 2023-06-22: qty 6, 42d supply, fill #2

## 2022-11-07 ENCOUNTER — Ambulatory Visit: Payer: BLUE CROSS/BLUE SHIELD | Admitting: Pharmacist

## 2022-11-29 ENCOUNTER — Encounter: Payer: BLUE CROSS/BLUE SHIELD | Admitting: Family Medicine

## 2022-12-22 ENCOUNTER — Encounter: Payer: BLUE CROSS/BLUE SHIELD | Attending: Family Medicine | Admitting: Dietician

## 2022-12-22 ENCOUNTER — Encounter: Payer: Self-pay | Admitting: Dietician

## 2022-12-22 VITALS — Ht 74.0 in | Wt 257.0 lb

## 2022-12-22 DIAGNOSIS — E119 Type 2 diabetes mellitus without complications: Secondary | ICD-10-CM

## 2022-12-22 DIAGNOSIS — Z794 Long term (current) use of insulin: Secondary | ICD-10-CM | POA: Insufficient documentation

## 2022-12-22 NOTE — Progress Notes (Signed)
Diabetes Self-Management Education  Visit Type: First/Initial  Appt. Start Time: 0905 Appt. End Time: 0925 Patient is here today with his mother.   Due to a faulty fire alarm in the building, this visit will not be charged and patient has been asked to reschedule.  12/22/2022  [name], identified by name and date of birth, is a 27 y.o. male with a diagnosis of Diabetes: Type 2.   ASSESSMENT  He complains of blurry vision (particularly in his left eye) and he wishes to know how to help this. He has a prescription for [employer] and has a reader at home.  His phone is not compatible.  They have tried to get this to work but did not understand the system.  Explained that they can watch a You Tube video from the [place] company or come in for training. He is not using glucagon and has not picked this up.  Explained how to use this product, how it works and when to use it.  Reviewed treatment of hypoglycemia.  History includes:  Tyhpe 2 Diabetes, HTN Medications include:  metformin, insulin glargine-yfgn 14 units q am, glucagon Labs noted to include:  A1C 10.1% 09/29/2022 increased from 9.3% 04/15/2022  Patient lives with his aunt.  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  Height 6\' 2"  (1.88 m), weight 257 lb (116.6 kg). Body mass index is 33 kg/m.   Diabetes Self-Management Education - 12/22/22 1014       Visit Information   Visit Type First/Initial      Initial Visit   Diabetes Type Type 2    Date Diagnosed 2015    Are you currently following a meal plan? No    Are you taking your medications as prescribed? Yes      Health Coping   How would you rate your overall health? Good      Psychosocial Assessment   Patient Belief/Attitude about Diabetes Motivated to manage diabetes    What is the hardest part about your diabetes right now, causing you the most concern, or is the  most worrisome to you about your diabetes?   Checking blood sugar    Self-care barriers None    Self-management support Doctor's office    Other persons present Patient;Family Member    Patient Concerns Nutrition/Meal planning;Glycemic Control;Monitoring    Special Needs None    Preferred Learning Style No preference indicated    Learning Readiness Ready    How often do you need to have someone help you when you read instructions, pamphlets, or other written materials from your doctor or pharmacy? 1 - Never    What is the last grade level you completed in school? 12      Pre-Education Assessment   Patient understands the diabetes disease and treatment process. Needs Instruction    Patient understands incorporating nutritional management into lifestyle. Needs Instruction    Patient undertands incorporating physical activity into lifestyle. Needs Instruction    Patient understands using medications safely. Needs Instruction    Patient understands monitoring blood glucose, interpreting and using results Needs Instruction    Patient understands prevention, detection, and treatment of acute complications. Needs Instruction    Patient understands prevention, detection, and treatment of chronic complications. Needs Instruction    Patient understands how to develop strategies to address psychosocial issues. Needs Instruction    Patient understands how  to develop strategies to promote health/change behavior. Needs Instruction      Complications   Last HgB A1C per patient/outside source 10.1 %   09/29/2022 increased from 9.3% 04/25/2022   How often do you check your blood sugar? 0 times/day (not testing)    Have you had a dilated eye exam in the past 12 months? No    Have you had a dental exam in the past 12 months? No    Are you checking your feet? No      Activity / Exercise   Activity / Exercise Type Moderate (swimming / aerobic walking)   job related   How many days per week do you exercise?  3    How many minutes per day do you exercise? 60    Total minutes per week of exercise 180      Patient Education   Previous Diabetes Education No    Disease Pathophysiology --   intro to CGM   Medications Reviewed patients medication for diabetes, action, purpose, timing of dose and side effects.    Monitoring Other (comment)    Acute complications Taught prevention, symptoms, and  treatment of hypoglycemia - the 15 rule.    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 Needs Instruction    Patient understands incorporating nutritional management into lifestyle. Needs Instruction    Patient undertands incorporating physical activity into lifestyle. Needs Instruction    Patient understands using medications safely. Needs Instruction    Patient understands monitoring blood glucose, interpreting and using results Needs Instruction    Patient understands prevention, detection, and treatment of acute complications. Needs Review    Patient understands prevention, detection, and treatment of chronic complications. Needs Instruction    Patient understands how to develop strategies to address psychosocial issues. Needs Instruction    Patient understands how to develop strategies to promote health/change behavior. Needs Instruction      Outcomes   Expected Outcomes Demonstrated interest in learning. Expect positive outcomes    Future DMSE PRN    Program Status Not Completed              Individualized Plan for Diabetes Self-Management Training:   Learning Objective:  Patient will have a greater understanding of diabetes self-management. Patient education plan is to attend individual and/or group sessions per assessed needs and concerns.   Plan:   There are no Patient Instructions on file for this visit.  Expected Outcomes:  Demonstrated interest in learning. Expect positive outcomes  Education material provided:   If problems or questions, patient to contact team via:  Phone  Future DSME appointment: PRN

## 2023-01-09 ENCOUNTER — Other Ambulatory Visit: Payer: Self-pay

## 2023-01-16 ENCOUNTER — Other Ambulatory Visit: Payer: Self-pay

## 2023-02-20 ENCOUNTER — Other Ambulatory Visit: Payer: Self-pay

## 2023-02-20 NOTE — Progress Notes (Cosign Needed Addendum)
Andro Grosser 09-Jul-1995 [id number]  Patient outreached by Thomasene Ripple , PharmD Candidate on 02/20/23.  Blood Pressure Readings: Last documented ambulatory systolic blood pressure: 132 Last documented ambulatory diastolic blood pressure: 93 Does the patient have a validated home blood pressure machine?: No (Patient will call number on back of insurance card)   Medication review was performed. Is the patient taking their medications as prescribed?: Yes   The following barriers to adherence were noted: Does the patient have cost concerns?: No Does the patient have transportation concerns?: No Does the patient need assistance obtaining refills?: No Does the patient occassionally forget to take some of their prescribed medications?: Yes (misses about 3-4 doses per month) Does the patient feel like one/some of their medications make them feel poorly?: No Does the patient have questions or concerns about their medications?: No Does the patient have a follow up scheduled with their primary care provider/cardiologist?: Yes   Interventions: Interventions Completed: Medications were reviewed, Patient was educated on how to access home blood pressure machine, Patient was educated on proper technique to check home blood pressure and reminded to bring home machine and readings to next provider appointment, Patient was counseled on lifestyle modifications to improve blood pressure, including exercise and DASH diet.   The patient has follow up scheduled:  PCP: Georganna Skeans, MD   Thomasene Ripple, Student-PharmD

## 2023-06-11 ENCOUNTER — Encounter (HOSPITAL_COMMUNITY): Payer: Self-pay

## 2023-06-11 ENCOUNTER — Emergency Department (HOSPITAL_COMMUNITY): Payer: BLUE CROSS/BLUE SHIELD

## 2023-06-11 ENCOUNTER — Emergency Department (HOSPITAL_COMMUNITY)
Admission: EM | Admit: 2023-06-11 | Discharge: 2023-06-11 | Disposition: A | Discharge status: Home / Self Care | Payer: BLUE CROSS/BLUE SHIELD | Attending: Emergency Medicine | Admitting: Emergency Medicine

## 2023-06-11 ENCOUNTER — Other Ambulatory Visit: Payer: Self-pay

## 2023-06-11 DIAGNOSIS — Z7984 Long term (current) use of oral hypoglycemic drugs: Secondary | ICD-10-CM | POA: Insufficient documentation

## 2023-06-11 DIAGNOSIS — I10 Essential (primary) hypertension: Secondary | ICD-10-CM | POA: Insufficient documentation

## 2023-06-11 DIAGNOSIS — E860 Dehydration: Secondary | ICD-10-CM | POA: Insufficient documentation

## 2023-06-11 DIAGNOSIS — R55 Syncope and collapse: Secondary | ICD-10-CM | POA: Insufficient documentation

## 2023-06-11 DIAGNOSIS — E1165 Type 2 diabetes mellitus with hyperglycemia: Secondary | ICD-10-CM | POA: Diagnosis not present

## 2023-06-11 DIAGNOSIS — Z794 Long term (current) use of insulin: Secondary | ICD-10-CM | POA: Diagnosis not present

## 2023-06-11 DIAGNOSIS — R569 Unspecified convulsions: Secondary | ICD-10-CM | POA: Diagnosis not present

## 2023-06-11 DIAGNOSIS — Z79899 Other long term (current) drug therapy: Secondary | ICD-10-CM | POA: Insufficient documentation

## 2023-06-11 LAB — URINALYSIS, ROUTINE W REFLEX MICROSCOPIC
Bacteria, UA: NONE SEEN
Bilirubin Urine: NEGATIVE
Glucose, UA: >=500 mg/dL — AB
Hgb urine dipstick: NEGATIVE
Ketones, ur: NEGATIVE mg/dL
Leukocytes,Ua: NEGATIVE
Nitrite: NEGATIVE
Protein, ur: 30 mg/dL — AB
Specific Gravity, Urine: 1.025 (ref 1.005–1.030)
pH: 7 (ref 5.0–8.0)

## 2023-06-11 LAB — I-STAT VENOUS BLOOD GAS, ED
Acid-Base Excess: 5 mmol/L — ABNORMAL HIGH (ref 0.0–2.0)
Bicarbonate: 29.8 mmol/L — ABNORMAL HIGH (ref 20.0–28.0)
Calcium, Ion: 1.11 mmol/L — ABNORMAL LOW (ref 1.15–1.40)
HCT: 43 % (ref 39.0–52.0)
Hemoglobin: 14.6 g/dL (ref 13.0–17.0)
O2 Saturation: 99 %
Potassium: 4.5 mmol/L (ref 3.5–5.1)
Sodium: 135 mmol/L (ref 135–145)
TCO2: 31 mmol/L (ref 22–32)
pCO2, Ven: 43.8 mm[Hg] — ABNORMAL LOW (ref 44–60)
pH, Ven: 7.441 — ABNORMAL HIGH (ref 7.25–7.43)
pO2, Ven: 151 mm[Hg] — ABNORMAL HIGH (ref 32–45)

## 2023-06-11 LAB — COMPREHENSIVE METABOLIC PANEL
ALT: 24 U/L (ref 0–44)
AST: 22 U/L (ref 15–41)
Albumin: 3.6 g/dL (ref 3.5–5.0)
Alkaline Phosphatase: 59 U/L (ref 38–126)
Anion gap: 11 (ref 5–15)
BUN: 6 mg/dL (ref 6–20)
CO2: 25 mmol/L (ref 22–32)
Calcium: 8.9 mg/dL (ref 8.9–10.3)
Chloride: 98 mmol/L (ref 98–111)
Creatinine, Ser: 0.72 mg/dL (ref 0.61–1.24)
GFR, Estimated: >60 mL/min (ref >60)
Glucose, Bld: 264 mg/dL — ABNORMAL HIGH (ref 70–99)
Potassium: 4.3 mmol/L (ref 3.5–5.1)
Sodium: 134 mmol/L — ABNORMAL LOW (ref 135–145)
Total Bilirubin: 0.9 mg/dL (ref 0.0–1.2)
Total Protein: 7.3 g/dL (ref 6.5–8.1)

## 2023-06-11 LAB — CBC WITH DIFFERENTIAL/PLATELET
Abs Immature Granulocytes: 0.01 10*3/uL (ref 0.00–0.07)
Basophils Absolute: 0 10*3/uL (ref 0.0–0.1)
Basophils Relative: 0 %
Eosinophils Absolute: 0.1 10*3/uL (ref 0.0–0.5)
Eosinophils Relative: 1 %
HCT: 42 % (ref 39.0–52.0)
Hemoglobin: 14.5 g/dL (ref 13.0–17.0)
Immature Granulocytes: 0 %
Lymphocytes Relative: 20 %
Lymphs Abs: 1.5 10*3/uL (ref 0.7–4.0)
MCH: 28.1 pg (ref 26.0–34.0)
MCHC: 34.5 g/dL (ref 30.0–36.0)
MCV: 81.4 fL (ref 80.0–100.0)
Monocytes Absolute: 0.4 10*3/uL (ref 0.1–1.0)
Monocytes Relative: 5 %
Neutro Abs: 5.7 10*3/uL (ref 1.7–7.7)
Neutrophils Relative %: 74 %
Platelets: 296 10*3/uL (ref 150–400)
RBC: 5.16 MIL/uL (ref 4.22–5.81)
RDW: 12.2 % (ref 11.5–15.5)
WBC: 7.8 10*3/uL (ref 4.0–10.5)
nRBC: 0 % (ref 0.0–0.2)

## 2023-06-11 LAB — BETA-HYDROXYBUTYRIC ACID: Beta-Hydroxybutyric Acid: 0.12 mmol/L (ref 0.05–0.27)

## 2023-06-11 LAB — CBG MONITORING, ED: Glucose-Capillary: 237 mg/dL — ABNORMAL HIGH (ref 70–99)

## 2023-06-11 MED ORDER — DIPHENHYDRAMINE HCL 50 MG/ML IJ SOLN
12.5000 mg | Freq: Once | INTRAMUSCULAR | Status: AC
  Administered 2023-06-11: 12.5 mg via INTRAVENOUS
  Filled 2023-06-11: qty 1

## 2023-06-11 MED ORDER — METOCLOPRAMIDE HCL 5 MG/ML IJ SOLN
10.0000 mg | Freq: Once | INTRAMUSCULAR | Status: AC
  Administered 2023-06-11: 10 mg via INTRAVENOUS
  Filled 2023-06-11: qty 2

## 2023-06-11 MED ORDER — SODIUM CHLORIDE 0.9 % IV BOLUS
1000.0000 mL | Freq: Once | INTRAVENOUS | Status: AC
  Administered 2023-06-11: 1000 mL via INTRAVENOUS

## 2023-06-11 NOTE — ED Notes (Signed)
 Pt is going to try for a UA sample

## 2023-06-11 NOTE — Discharge Instructions (Addendum)
 No driving for 6 months until cleared by neurology.  No swimming alone.    Take all your medications as directed by your doctor.

## 2023-06-11 NOTE — ED Provider Notes (Signed)
 [place] [facility] DEPARTMENT AT [facility] Provider Note   CSN: [id number] Arrival date & time: 06/11/23  1132     History  Chief Complaint  Patient presents with   Loss of Consciousness    Logan Dunn is a 28 y.o. male.  Pt is a 28 yo male with pmhx significant for dm2 and htn.  Pt got into an argument with his sig other and had a syncopal event.  Pt did have some shaking.  EMS reported that he was a little confused when they arrived.  He has not taken any of his meds since before Christmas.  He said he has them, but he's been away and did not take them with him.  He has pain in his head behind his eyes.  He feels like his eyes and mouth are dry.  No hx seizures.         Home Medications Prior to Admission medications   Medication Sig Start Date End Date Taking? Authorizing Provider  Accu-Chek Softclix Lancets lancets Use as instructed to check blood sugars up to 3 times per day 10/06/22   Delbert Clam, MD  acetone, urine, test strip Check ketones per protocol 08/03/15   Willo Rosina Kurk, MD  Alcohol  Swabs (ALCOHOL  PADS) 70 % PADS Use to clean skin prior to injections 2 times daily 08/03/15   Jessup, Ashley Bashioum, MD  Blood Glucose Monitoring Suppl (ACCU-CHEK GUIDE ME) w/Device KIT Use as instructed to check blood sugars up to 3 times per day 10/06/22   Delbert Clam, MD  Continuous Glucose Receiver (FREESTYLE LIBRE 2 READER) DEVI Use as instructed to check blood sugars up to 3 times per day 10/06/22   Delbert Clam, MD  Continuous Glucose Sensor (FREESTYLE LIBRE 2 SENSOR) MISC Use as instructed to check blood sugars up to 3 times per day 10/06/22   Delbert Clam, MD  gabapentin  (NEURONTIN ) 300 MG capsule Take 1 capsule (300 mg total) by mouth at bedtime. 10/07/22   Tanda Bleacher, MD  glucagon  1 MG injection Inject 1mg  IM if unconscious, seizing, or unable to eat to correct low blood sugar 08/03/15   Willo Rosina Kurk, MD  glucose blood (ACCU-CHEK GUIDE)  test strip Use as instructed to check blood sugars up to 3 times per day 10/06/22   Newlin, Enobong, MD  ibuprofen  (ADVIL ) 200 MG tablet Take 800 mg by mouth every 6 (six) hours as needed for mild pain or moderate pain.    [provider]  insulin  glargine-yfgn (SEMGLEE , YFGN,) 100 UNIT/ML Pen Inject 14 Units into the skin daily. 10/24/22   Newlin, Enobong, MD  Insulin  Pen Needle 32G X 4 MM MISC Use to inject insulin  once daily. 10/06/22   Newlin, Enobong, MD  lisinopril  (ZESTRIL ) 10 MG tablet Take 1 tablet (10 mg total) by mouth daily. 10/06/22   Newlin, Enobong, MD  metFORMIN  (GLUCOPHAGE ) 500 MG tablet Take 1 tablet (500 mg total) by mouth 2 (two) times daily with a meal. 10/06/22   Delbert Clam, MD      Allergies    Patient has no known allergies.    Review of Systems   Review of Systems  Neurological:  Positive for tremors, syncope and headaches.  All other systems reviewed and are negative.   Physical Exam Updated Vital Signs BP (!) 140/100   Pulse (!) 47   Resp 14   Ht 6' 2 (1.88 m)   Wt 113.4 kg   SpO2 100%   BMI 32.10 kg/m  Physical Exam Vitals and nursing note reviewed.  Constitutional:      Appearance: Normal appearance.  HENT:     Head: Normocephalic and atraumatic.     Right Ear: External ear normal.     Left Ear: External ear normal.     Nose: Nose normal.     Mouth/Throat:     Mouth: Mucous membranes are dry.  Eyes:     Extraocular Movements: Extraocular movements intact.     Conjunctiva/sclera: Conjunctivae normal.     Pupils: Pupils are equal, round, and reactive to light.  Cardiovascular:     Rate and Rhythm: Regular rhythm. Bradycardia present.     Pulses: Normal pulses.     Heart sounds: Normal heart sounds.  Pulmonary:     Effort: Pulmonary effort is normal.     Breath sounds: Normal breath sounds.  Abdominal:     General: Abdomen is flat. Bowel sounds are normal.     Palpations: Abdomen is soft.  Musculoskeletal:        General: Normal  range of motion.     Cervical back: Normal range of motion and neck supple.  Skin:    General: Skin is warm.     Capillary Refill: Capillary refill takes less than 2 seconds.  Neurological:     General: No focal deficit present.     Mental Status: He is alert and oriented to person, place, and time.  Psychiatric:        Mood and Affect: Mood normal.        Behavior: Behavior normal.     ED Results / Procedures / Treatments   Labs (all labs ordered are listed, but only abnormal results are displayed) Labs Reviewed  COMPREHENSIVE METABOLIC PANEL - Abnormal; Notable for the following components:      Result Value   Sodium 134 (*)    Glucose, Bld 264 (*)    All other components within normal limits  URINALYSIS, ROUTINE W REFLEX MICROSCOPIC - Abnormal; Notable for the following components:   Glucose, UA >=500 (*)    Protein, ur 30 (*)    All other components within normal limits  CBG MONITORING, ED - Abnormal; Notable for the following components:   Glucose-Capillary 237 (*)    All other components within normal limits  I-STAT VENOUS BLOOD GAS, ED - Abnormal; Notable for the following components:   pH, Ven 7.441 (*)    pCO2, Ven 43.8 (*)    pO2, Ven 151 (*)    Bicarbonate 29.8 (*)    Acid-Base Excess 5.0 (*)    Calcium , Ion 1.11 (*)    All other components within normal limits  CBC WITH DIFFERENTIAL/PLATELET  BETA-HYDROXYBUTYRIC ACID    EKG EKG Interpretation Date/Time:  Sunday June 11 2023 11:57:20 EST Ventricular Rate:  56 PR Interval:  161 QRS Duration:  91 QT Interval:  378 QTC Calculation: 365 R Axis:   81  Text Interpretation: Sinus arrhythmia Anterior infarct, old No significant change since last tracing Confirmed by Dean Clarity [phone]) on 06/11/2023 12:05:52 PM  Radiology DG Chest Port 1 View Result Date: 06/11/2023 CLINICAL DATA:  Syncope.  Loss of consciousness. EXAM: PORTABLE CHEST 1 VIEW COMPARISON:  Radiographs 11/01/2015 and 07/30/2015. FINDINGS:  1225 hours. Lordotic positioning. The heart size and mediastinal contours are normal. The lungs are clear. There is no pleural effusion or pneumothorax. No acute osseous findings are identified. IMPRESSION: No evidence of acute cardiopulmonary process. Electronically Signed   By: Elsie Gertrude HERO.D.  On: 06/11/2023 12:38   CT Head Wo Contrast Result Date: 06/11/2023 CLINICAL DATA:  Headache, sudden, severe. EXAM: CT HEAD WITHOUT CONTRAST TECHNIQUE: Contiguous axial images were obtained from the base of the skull through the vertex without intravenous contrast. RADIATION DOSE REDUCTION: This exam was performed according to the departmental dose-optimization program which includes automated exposure control, adjustment of the mA and/or kV according to patient size and/or use of iterative reconstruction technique. COMPARISON:  Head CT 07/31/2015 FINDINGS: Brain: There is no evidence of an acute infarct, intracranial hemorrhage, mass, midline shift, or extra-axial fluid collection. The ventricles and sulci are normal. Vascular: No hyperdense vessel. Skull: No acute fracture or suspicious osseous lesion. Sinuses/Orbits: Right larger than left maxillary sinus mucous retention cysts. Mild mid to posterior ethmoid air cell opacification on the right. Clear mastoid air cells. Unremarkable orbits. Other: None. IMPRESSION: Unremarkable CT appearance of the brain. Electronically Signed   By: Dasie Hamburg M.D.   On: 06/11/2023 12:18    Procedures Procedures    Medications Ordered in ED Medications  sodium chloride  0.9 % bolus 1,000 mL (0 mLs Intravenous Stopped 06/11/23 1302)  metoCLOPramide  (REGLAN ) injection 10 mg (10 mg Intravenous Given 06/11/23 1202)  diphenhydrAMINE  (BENADRYL ) injection 12.5 mg (12.5 mg Intravenous Given 06/11/23 1201)    ED Course/ Medical Decision Making/ A&P                                 Medical Decision Making Amount and/or Complexity of Data Reviewed Labs: ordered. Radiology:  ordered.  Risk Prescription drug management.   This patient presents to the ED for concern of syncope, this involves an extensive number of treatment options, and is a complaint that carries with it a high risk of complications and morbidity.  The differential diagnosis includes syncope, seizure, electrolyte abn, infection, cardiac   Co morbidities that complicate the patient evaluation  Dm2 and htn   Additional history obtained:  Additional history obtained from epic chart review External records from outside source obtained and reviewed including EMS report   Lab Tests:  I Ordered, and personally interpreted labs.  The pertinent results include:  cbc nl   Imaging Studies ordered:  I ordered imaging studies including cxr and head ct  I independently visualized and interpreted imaging which showed  CXR: No evidence of acute cardiopulmonary process.  CT head: Unremarkable CT appearance of the brain.  I agree with the radiologist interpretation   Cardiac Monitoring:  The patient was maintained on a cardiac monitor.  I personally viewed and interpreted the cardiac monitored which showed an underlying rhythm of: sb   Medicines ordered and prescription drug management:  I ordered medication including ivfs/reglan /benadryl   for sx  Reevaluation of the patient after these medicines showed that the patient improved I have reviewed the patients home medicines and have made adjustments as needed   Test Considered:  ct   Critical Interventions:  ivfs  Problem List / ED Course:  Syncope vs seizure:  pt is back to normal now.  He is feeling better after meds and ivfs.  Labs and ct/xr nl.  He is stable for d/c.  He is given seizure precautions and told not to drive for 6 months.  He is to f/u with pcp and with neurology.  Return if worse.    Reevaluation:  After the interventions noted above, I reevaluated the patient and found that they have :improved   Social  Determinants of Health:  Lives at home   Dispostion:  After consideration of the diagnostic results and the patients response to treatment, I feel that the patent would benefit from discharge with outpatient f/u.          Final Clinical Impression(s) / ED Diagnoses Final diagnoses:  Dehydration  Seizure-like activity (HCC)  Syncope, unspecified syncope type  Hyperglycemia due to diabetes mellitus (HCC)    Rx / DC Orders ED Discharge Orders          Ordered    Ambulatory referral to Neurology       Comments: An appointment is requested in approximately: 1 week   06/11/23 1417              Dean Clarity, MD 06/11/23 1421

## 2023-06-11 NOTE — ED Notes (Signed)
 Patient transported to CT

## 2023-06-11 NOTE — ED Notes (Signed)
 Pt did well ambulated in the hall way. I asked pt if he was dizzy when walking out of the room. The pt stated that he felt a little dizzy. As we continued  to walk down the hall I asked the pt if his dizziness was getting better or worse. Pt stated that it was getting better when walk back to room. During the walk pt kept a steady gait and was leading the way. Pt is now sitting on the side of the bed

## 2023-06-11 NOTE — ED Triage Notes (Signed)
 Pt to ED via [facility] from home. Pt's girlfriend called because pt had shaking and then a syncopal episode. Pt was confused on EMS arrival. Pt has not had medications since before 05/31/23. 20g LAC started PTA.  EMS VS 164/102 HR 56 97% RA RR 16 Cbg 319

## 2023-06-20 ENCOUNTER — Encounter: Payer: Self-pay | Admitting: Family Medicine

## 2023-06-20 ENCOUNTER — Ambulatory Visit (INDEPENDENT_AMBULATORY_CARE_PROVIDER_SITE_OTHER): Payer: BLUE CROSS/BLUE SHIELD | Admitting: Family Medicine

## 2023-06-20 ENCOUNTER — Other Ambulatory Visit: Payer: Self-pay

## 2023-06-20 VITALS — BP 129/85 | HR 100 | Temp 98.8°F | Resp 16 | Ht 74.0 in | Wt 255.4 lb

## 2023-06-20 DIAGNOSIS — Z794 Long term (current) use of insulin: Secondary | ICD-10-CM

## 2023-06-20 DIAGNOSIS — Z23 Encounter for immunization: Secondary | ICD-10-CM | POA: Diagnosis not present

## 2023-06-20 DIAGNOSIS — M79671 Pain in right foot: Secondary | ICD-10-CM

## 2023-06-20 DIAGNOSIS — E1169 Type 2 diabetes mellitus with other specified complication: Secondary | ICD-10-CM

## 2023-06-20 DIAGNOSIS — Z7984 Long term (current) use of oral hypoglycemic drugs: Secondary | ICD-10-CM

## 2023-06-20 DIAGNOSIS — E119 Type 2 diabetes mellitus without complications: Secondary | ICD-10-CM

## 2023-06-20 LAB — POCT GLYCOSYLATED HEMOGLOBIN (HGB A1C): Hemoglobin A1C: 11.1 % — AB (ref 4.0–5.6)

## 2023-06-20 MED ORDER — LISINOPRIL 20 MG PO TABS
20.0000 mg | ORAL_TABLET | Freq: Every day | ORAL | 1 refills | Status: AC
  Filled 2023-06-20: qty 90, 90d supply, fill #0

## 2023-06-21 ENCOUNTER — Encounter: Payer: Self-pay | Admitting: Family Medicine

## 2023-06-21 NOTE — Progress Notes (Signed)
 Established Patient Office Visit  Subjective    Patient ID: Logan Dunn, male    DOB: 03-19-1996  Age: 28 y.o. MRN: [id number]  CC:  Chief Complaint  Patient presents with   Hospitalization Follow-up    HPI Logan Dunn presents or follow up of diabetes. Patient reports that he has been complacent with managing his diabetes but that he now has a baby due and his baby's mom and aunt are here with him today. He also has a callous on his foot that is becoming painful.   Outpatient Encounter Medications as of 06/20/2023  Medication Sig   Accu-Chek Softclix Lancets lancets Use as instructed to check blood sugars up to 3 times per day   acetone, urine, test strip Check ketones per protocol   Alcohol  Swabs (ALCOHOL  PADS) 70 % PADS Use to clean skin prior to injections 2 times daily   Blood Glucose Monitoring Suppl (ACCU-CHEK GUIDE ME) w/Device KIT Use as instructed to check blood sugars up to 3 times per day   Continuous Glucose Receiver (FREESTYLE LIBRE 2 READER) DEVI Use as instructed to check blood sugars up to 3 times per day   Continuous Glucose Sensor (FREESTYLE LIBRE 2 SENSOR) MISC Use as instructed to check blood sugars up to 3 times per day   gabapentin  (NEURONTIN ) 300 MG capsule Take 1 capsule (300 mg total) by mouth at bedtime.   glucagon  1 MG injection Inject 1mg  IM if unconscious, seizing, or unable to eat to correct low blood sugar   glucose blood (ACCU-CHEK GUIDE) test strip Use as instructed to check blood sugars up to 3 times per day   ibuprofen  (ADVIL ) 200 MG tablet Take 800 mg by mouth every 6 (six) hours as needed for mild pain or moderate pain.   insulin  glargine-yfgn (SEMGLEE , YFGN,) 100 UNIT/ML Pen Inject 14 Units into the skin daily.   Insulin  Pen Needle 32G X 4 MM MISC Use to inject insulin  once daily.   lisinopril  (ZESTRIL ) 10 MG tablet Take 1 tablet (10 mg total) by mouth daily.   lisinopril  (ZESTRIL ) 20 MG tablet Take 1 tablet (20 mg total) by mouth daily.    metFORMIN  (GLUCOPHAGE ) 500 MG tablet Take 1 tablet (500 mg total) by mouth 2 (two) times daily with a meal.   No facility-administered encounter medications on file as of 06/20/2023.    Past Medical History:  Diagnosis Date   Diabetes (HCC)    Diabetes mellitus without complication (HCC)    Hypertension     Past Surgical History:  Procedure Laterality Date   NO PAST SURGERIES      Family History  Problem Relation Age of Onset   Hypertension Mother    Diabetes Father    Hypertension Father    Cancer Maternal Grandmother    Cancer Maternal Grandfather    Heart disease Mother    Diabetes Maternal Grandmother    Heart disease Maternal Grandmother     Social History   Socioeconomic History   Marital status: Single    Spouse name: Not on file   Number of children: Not on file   Years of education: Not on file   Highest education level: 11th grade  Occupational History   Not on file  Tobacco Use   Smoking status: Never   Smokeless tobacco: Never  Vaping Use   Vaping status: Never Used  Substance and Sexual Activity   Alcohol  use: No   Drug use: No   Sexual activity: Not Currently  Birth control/protection: Condom  Other Topics Concern   Not on file  Social History Narrative   ** Merged History Encounter **       Social Drivers of Health   Financial Resource Strain: High Risk (09/29/2022)   Overall Financial Resource Strain (CARDIA)    Difficulty of Paying Living Expenses: Very hard  Food Insecurity: Food Insecurity Present (09/29/2022)   Hunger Vital Sign    Worried About Running Out of Food in the Last Year: Often true    Ran Out of Food in the Last Year: Often true  Transportation Needs: No Transportation Needs (09/29/2022)   PRAPARE - Administrator, Civil Service (Medical): No    Lack of Transportation (Non-Medical): No  Physical Activity: Sufficiently Active (09/29/2022)   Exercise Vital Sign    Days of Exercise per Week: 5 days    Minutes  of Exercise per Session: 60 min  Stress: Patient Declined (09/29/2022)   [employer] of Occupational Health - Occupational Stress Questionnaire    Feeling of Stress : Patient declined  Social Connections: Unknown (09/29/2022)   Social Connection and Isolation Panel [NHANES]    Frequency of Communication with Friends and Family: More than three times a week    Frequency of Social Gatherings with Friends and Family: More than three times a week    Attends Religious Services: Patient declined    Database Administrator or Organizations: No    Attends Engineer, Structural: Not on file    Marital Status: Never married  Intimate Partner Violence: Not on file    Review of Systems  All other systems reviewed and are negative.       Objective    BP 129/85 (BP Location: Left Arm, Patient Position: Sitting, Cuff Size: Normal)   Pulse 100   Temp 98.8 F (37.1 C) (Oral)   Resp 16   Ht 6' 2 (1.88 m)   Wt 255 lb 6.4 oz (115.8 kg)   SpO2 95%   BMI 32.79 kg/m   Physical Exam Vitals and nursing note reviewed.  Constitutional:      General: He is not in acute distress. Cardiovascular:     Rate and Rhythm: Normal rate and regular rhythm.  Pulmonary:     Effort: Pulmonary effort is normal.     Breath sounds: Normal breath sounds.  Abdominal:     Palpations: Abdomen is soft.     Tenderness: There is no abdominal tenderness.  Musculoskeletal:        General: Deformity: .diagmed.  Skin:    Comments: Right foot with large thickened callous on medial aspect of plantar surface  Neurological:     General: No focal deficit present.     Mental Status: He is alert and oriented to person, place, and time.         Assessment & Plan:   1. Type 2 diabetes mellitus with other specified complication, without long-term current use of insulin  (HCC) (Primary) Increasing A1c and way above goal. Will refer to [facility] for med management. Referral for diabetic education. Discussed  compliance.  - HgB A1c - Ambulatory referral to diabetic education  2. Right foot pain Extensive callous noted. Referral for further eval/mgt - Ambulatory referral to Podiatry  3. Immunization due  - Pneumococcal conjugate vaccine 20-valent (Prevnar 20)  4. Encounter for long-term (current) use of insulin  (HCC)   5. Diabetes mellitus treated with oral medication (HCC)     Return in about 3 months (  around 09/18/2023) for follow up, chronic med issues.   Tanda Raguel SQUIBB, MD

## 2023-06-22 ENCOUNTER — Other Ambulatory Visit: Payer: Self-pay

## 2023-06-22 ENCOUNTER — Other Ambulatory Visit: Payer: Self-pay | Admitting: Family Medicine

## 2023-06-22 MED ORDER — METFORMIN HCL 500 MG PO TABS
500.0000 mg | ORAL_TABLET | Freq: Two times a day (BID) | ORAL | 1 refills | Status: DC
  Filled 2023-06-22: qty 180, 90d supply, fill #0

## 2023-06-22 NOTE — Telephone Encounter (Signed)
Requested Prescriptions  Pending Prescriptions Disp Refills   metFORMIN (GLUCOPHAGE) 500 MG tablet 180 tablet 1    Sig: Take 1 tablet (500 mg total) by mouth 2 (two) times daily with a meal.     Endocrinology:  Diabetes - Biguanides Failed - 06/22/2023  4:43 PM      Failed - HBA1C is between 0 and 7.9 and within 180 days    Hemoglobin A1C  Date Value Ref Range Status  06/20/2023 11.1 (A) 4.0 - 5.6 % Final   Hgb A1c MFr Bld  Date Value Ref Range Status  04/15/2022 9.3 (H) 4.8 - 5.6 % Final    Comment:    (NOTE) Pre diabetes:          5.7%-6.4%  Diabetes:              >6.4%  Glycemic control for   <7.0% adults with diabetes          Failed - B12 Level in normal range and within 720 days    No results found for: "VITAMINB12"       Passed - Cr in normal range and within 360 days    Creatinine, Ser  Date Value Ref Range Status  06/11/2023 0.72 0.61 - 1.24 mg/dL Final         Passed - eGFR in normal range and within 360 days    GFR calc Af Amer  Date Value Ref Range Status  03/13/2019 >60 >60 mL/min Final   GFR, Estimated  Date Value Ref Range Status  06/11/2023 >60 >60 mL/min Final    Comment:    (NOTE) Calculated using the CKD-EPI Creatinine Equation (2021)          Passed - Valid encounter within last 6 months    Recent Outpatient Visits           2 days ago Type 2 diabetes mellitus with other specified complication, without long-term current use of insulin (HCC)   [facility], MD   8 months ago Type 2 diabetes mellitus without complication, with long-term current use of insulin (HCC)   [facility] [place] Lois Huxley, [place] L, RPH-CPP   8 months ago Type 2 diabetes mellitus without complication, with long-term current use of insulin [facility])   [facility] [facility], Lauris Poag, MD   4 years ago Type 2 diabetes mellitus without  complication, with long-term current use of insulin (HCC)   [facility] [place] Cain Saupe, MD       Future Appointments             In 1 week Lois Huxley, Cornelius Moras, RPH-CPP [facility] [place]   In 2 months Georganna Skeans, MD [facility] - CBC within normal limits and completed in the last 12 months    WBC  Date Value Ref Range Status  06/11/2023 7.8 4.0 - 10.5 K/uL Final   RBC  Date Value Ref Range Status  06/11/2023 5.16 4.22 - 5.81 MIL/uL Final   Hemoglobin  Date Value Ref Range Status  06/11/2023 14.6 13.0 - 17.0 g/dL Final   HCT  Date Value Ref Range Status  06/11/2023  43.0 39.0 - 52.0 % Final   MCHC  Date Value Ref Range Status  06/11/2023 34.5 30.0 - 36.0 g/dL Final   [facility]  Date Value Ref Range Status  06/11/2023 28.1 26.0 - 34.0 pg Final   MCV  Date Value Ref Range Status  06/11/2023 81.4 80.0 - 100.0 fL Final   No results found for: "PLTCOUNTKUC", "LABPLAT", "POCPLA" RDW  Date Value Ref Range Status  06/11/2023 12.2 11.5 - 15.5 % Final

## 2023-06-23 ENCOUNTER — Other Ambulatory Visit: Payer: Self-pay

## 2023-06-23 MED ORDER — GABAPENTIN 300 MG PO CAPS
300.0000 mg | ORAL_CAPSULE | Freq: Every day | ORAL | 0 refills | Status: DC
  Filled 2023-06-23: qty 90, 90d supply, fill #0

## 2023-06-28 NOTE — Progress Notes (Deleted)
S:     No chief complaint on file.  28 y.o. male with PMHx significant for HTN, T2DM (complicated by history of DKA) who presents for diabetes evaluation, education, and management.   Patient was referred and last seen by Primary Care Provider, Dr. Georganna Skeans, on 06/20/2023. A1c was found to be 11.1 at this visit. Patient denied poor adherence to diabetes medications.     Patient was last seen by clinic pharmacist in May 2024. At that time A1c was 10.1%. He reported poor adhernece to insulin due to gaps in insurance coverage. He was restarted on Lantus 14 units daily, metformin 500 mg BID, and given CGM supplies.     Patient arrives in *** good spirits and presents without *** any assistance. ***Patient is accompanied by ***.   Patient reports Diabetes was diagnosed in 2017.   Family/Social History: ***  Current diabetes medications include:  -Insulin glargine 14 units every day  -Metformin 500 mg BID   Current hypertension medications include:  -Lisinopril 20 mg every day  Current hyperlipidemia medications include: ***  Patient reports adherence to taking all medications as prescribed.  *** Patient denies adherence with medications, reports missing *** medications *** times per week, on average.  Do you feel that your medications are working for you? {YES NO:22349} Have you been experiencing any side effects to the medications prescribed? {YES NO:22349} Do you have any problems obtaining medications due to transportation or finances? {YES [id number] Insurance coverage: ***  Patient {Actions; denies-reports:120008} hypoglycemic events.  Reported home fasting blood sugars: ***  Reported 2 hour post-meal/random blood sugars: ***.  Patient {Actions; denies-reports:120008} nocturia (nighttime urination).  Patient {Actions; denies-reports:120008} neuropathy (nerve pain). Patient {Actions; denies-reports:120008} visual changes. Patient {Actions; denies-reports:120008}  self foot exams.   Patient reported dietary habits: Eats *** meals/day Breakfast: *** Lunch: *** Dinner: *** Snacks: *** Drinks: ***  Within the past 12 months, did you worry whether your food would run out before you got money to buy more? {YES NO:22349} Within the past 12 months, did the food you bought run out, and you didn't have money to get more? {YES NO:22349} PHQ-9 Score: ***  Patient-reported exercise habits: ***   O:    7 day average blood glucose: ***  Libre3 CGM Download today *** % Time CGM is active: ***% Average Glucose: *** mg/dL Glucose Management Indicator: ***  Glucose Variability: ***% (goal <36%) Time in Goal:  - Time in range 70-180: ***% - Time above range: ***% - Time below range: ***% Observed patterns:   Lab Results  Component Value Date   HGBA1C 11.1 (A) 06/20/2023   There were no vitals filed for this visit.  Lipid Panel  No results found for: "CHOL", "TRIG", "HDL", "CHOLHDL", "VLDL", "LDLCALC", "LDLDIRECT"  Clinical Atherosclerotic Cardiovascular Disease (ASCVD): {YES/NO:21197} The ASCVD Risk score (Arnett DK, et al., 2019) failed to calculate for the following reasons:   The 2019 ASCVD risk score is only valid for ages 1 to 71   Patient is participating in a Managed Medicaid Plan:  {MM YES/NO:27447::"Yes"}   A/P: Diabetes longstanding *** currently ***. Patient is *** able to verbalize appropriate hypoglycemia management plan. Medication adherence appears ***. Control is suboptimal due to ***. -{Meds adjust:18428} basal insulin *** Lantus/Basaglar/Semglee (insulin glargine) *** Tresiba (insulin degludec) from *** units to *** units daily in the morning. Patient will continue to titrate 1 unit every *** days if fasting blood sugar > 100mg /dl until fasting blood sugars reach goal or next  visit.  -{Meds adjust:18428} rapid insulin *** Novolog (insulin aspart) *** Humalog (insulin lispro) from *** to ***.  -{Meds adjust:18428} GLP-1 ***  Trulicity (dulaglutide) *** Ozempic (semaglutide) *** Mounjaro (tirzepatide) from *** mg to *** mg .  -{Meds adjust:18428} SGLT2-I *** Farxiga (dapagliflozin) *** Jardiance (empagliflozin) 10 mg. Counseled on sick day rules. -{Meds adjust:18428} metformin ***.  -Patient educated on purpose, proper use, and potential adverse effects of ***.  -Extensively discussed pathophysiology of diabetes, recommended lifestyle interventions, dietary effects on blood sugar control.  -Counseled on s/sx of and management of hypoglycemia.  -Next A1c anticipated ***.   ASCVD risk - primary ***secondary prevention in patient with diabetes. Last LDL is *** not at goal of <16 *** mg/dL. ASCVD risk factors include *** and 10-year ASCVD risk score of ***. {Desc; low/moderate/high:110033} intensity statin indicated.  -{Meds adjust:18428} ***statin *** mg.   Hypertension longstanding *** currently ***. Blood pressure goal of <130/80 *** mmHg. Medication adherence ***. Blood pressure control is suboptimal due to ***. -{Meds adjust:18428} *** mg.  Written patient instructions provided. Patient verbalized understanding of treatment plan.  Total time in face to face counseling *** minutes.    Follow-up:  Pharmacist *** PCP clinic visit in *** Patient seen with ***

## 2023-06-29 ENCOUNTER — Ambulatory Visit: Payer: BLUE CROSS/BLUE SHIELD | Admitting: Pharmacist

## 2023-07-06 ENCOUNTER — Ambulatory Visit: Payer: BLUE CROSS/BLUE SHIELD | Admitting: Neurology

## 2023-07-06 ENCOUNTER — Encounter: Payer: Self-pay | Admitting: Neurology

## 2023-07-13 ENCOUNTER — Inpatient Hospital Stay: Payer: BLUE CROSS/BLUE SHIELD | Admitting: Family

## 2023-07-17 ENCOUNTER — Telehealth: Payer: Self-pay | Admitting: Neurology

## 2023-07-17 NOTE — Telephone Encounter (Signed)
 Patient rescheduled appointment.

## 2023-07-17 NOTE — Telephone Encounter (Signed)
 Pt left a VM asking to r/s new pt appointment. I called and left him a VM asking him to call us  back.

## 2023-07-18 ENCOUNTER — Telehealth: Payer: Self-pay | Admitting: Family Medicine

## 2023-07-18 NOTE — Telephone Encounter (Signed)
Patient was identified as falling into the [place] Measure - Diabetes.   Patient was seen 06/20/2023 has follow up appointment on 09/19/2023

## 2023-09-05 ENCOUNTER — Other Ambulatory Visit: Payer: Self-pay

## 2023-09-05 ENCOUNTER — Emergency Department (HOSPITAL_BASED_OUTPATIENT_CLINIC_OR_DEPARTMENT_OTHER)

## 2023-09-05 ENCOUNTER — Emergency Department (HOSPITAL_BASED_OUTPATIENT_CLINIC_OR_DEPARTMENT_OTHER)
Admission: EM | Admit: 2023-09-05 | Discharge: 2023-09-05 | Disposition: A | Discharge status: Home / Self Care | Attending: Emergency Medicine | Admitting: Emergency Medicine

## 2023-09-05 DIAGNOSIS — E1165 Type 2 diabetes mellitus with hyperglycemia: Secondary | ICD-10-CM | POA: Insufficient documentation

## 2023-09-05 DIAGNOSIS — R202 Paresthesia of skin: Secondary | ICD-10-CM | POA: Diagnosis not present

## 2023-09-05 DIAGNOSIS — E119 Type 2 diabetes mellitus without complications: Secondary | ICD-10-CM | POA: Diagnosis not present

## 2023-09-05 DIAGNOSIS — Z7984 Long term (current) use of oral hypoglycemic drugs: Secondary | ICD-10-CM | POA: Diagnosis not present

## 2023-09-05 DIAGNOSIS — I10 Essential (primary) hypertension: Secondary | ICD-10-CM | POA: Insufficient documentation

## 2023-09-05 DIAGNOSIS — Z79899 Other long term (current) drug therapy: Secondary | ICD-10-CM | POA: Diagnosis not present

## 2023-09-05 DIAGNOSIS — Z794 Long term (current) use of insulin: Secondary | ICD-10-CM | POA: Insufficient documentation

## 2023-09-05 DIAGNOSIS — I498 Other specified cardiac arrhythmias: Secondary | ICD-10-CM | POA: Insufficient documentation

## 2023-09-05 DIAGNOSIS — R200 Anesthesia of skin: Secondary | ICD-10-CM | POA: Diagnosis present

## 2023-09-05 LAB — COMPREHENSIVE METABOLIC PANEL WITH GFR
ALT: 20 U/L (ref 0–44)
AST: 17 U/L (ref 15–41)
Albumin: 3.9 g/dL (ref 3.5–5.0)
Alkaline Phosphatase: 60 U/L (ref 38–126)
Anion gap: 7 (ref 5–15)
BUN: 8 mg/dL (ref 6–20)
CO2: 27 mmol/L (ref 22–32)
Calcium: 8.6 mg/dL — ABNORMAL LOW (ref 8.9–10.3)
Chloride: 101 mmol/L (ref 98–111)
Creatinine, Ser: 0.71 mg/dL (ref 0.61–1.24)
GFR, Estimated: >60 mL/min (ref >60)
Glucose, Bld: 255 mg/dL — ABNORMAL HIGH (ref 70–99)
Potassium: 4.2 mmol/L (ref 3.5–5.1)
Sodium: 135 mmol/L (ref 135–145)
Total Bilirubin: 0.4 mg/dL (ref 0.0–1.2)
Total Protein: 7.1 g/dL (ref 6.5–8.1)

## 2023-09-05 LAB — CK: Total CK: 77 U/L (ref 49–397)

## 2023-09-05 LAB — TROPONIN I (HIGH SENSITIVITY): Troponin I (High Sensitivity): <2 ng/L (ref <18)

## 2023-09-05 LAB — CBC WITH DIFFERENTIAL/PLATELET
Abs Immature Granulocytes: 0.02 10*3/uL (ref 0.00–0.07)
Basophils Absolute: 0 10*3/uL (ref 0.0–0.1)
Basophils Relative: 0 %
Eosinophils Absolute: 0.2 10*3/uL (ref 0.0–0.5)
Eosinophils Relative: 2 %
HCT: 40.2 % (ref 39.0–52.0)
Hemoglobin: 13.9 g/dL (ref 13.0–17.0)
Immature Granulocytes: 0 %
Lymphocytes Relative: 28 %
Lymphs Abs: 2.1 10*3/uL (ref 0.7–4.0)
MCH: 28.5 pg (ref 26.0–34.0)
MCHC: 34.6 g/dL (ref 30.0–36.0)
MCV: 82.4 fL (ref 80.0–100.0)
Monocytes Absolute: 0.5 10*3/uL (ref 0.1–1.0)
Monocytes Relative: 6 %
Neutro Abs: 4.8 10*3/uL (ref 1.7–7.7)
Neutrophils Relative %: 64 %
Platelets: 284 10*3/uL (ref 150–400)
RBC: 4.88 MIL/uL (ref 4.22–5.81)
RDW: 12.2 % (ref 11.5–15.5)
WBC: 7.5 10*3/uL (ref 4.0–10.5)
nRBC: 0 % (ref 0.0–0.2)

## 2023-09-05 LAB — CBG MONITORING, ED: Glucose-Capillary: 250 mg/dL — ABNORMAL HIGH (ref 70–99)

## 2023-09-05 MED ORDER — KETOROLAC TROMETHAMINE 30 MG/ML IJ SOLN
15.0000 mg | Freq: Once | INTRAMUSCULAR | Status: AC
  Administered 2023-09-05: 15 mg via INTRAVENOUS
  Filled 2023-09-05: qty 1

## 2023-09-05 MED ORDER — DIPHENHYDRAMINE HCL 50 MG/ML IJ SOLN
12.5000 mg | Freq: Once | INTRAMUSCULAR | Status: AC
  Administered 2023-09-05: 12.5 mg via INTRAVENOUS
  Filled 2023-09-05: qty 1

## 2023-09-05 MED ORDER — METOCLOPRAMIDE HCL 5 MG/ML IJ SOLN
10.0000 mg | Freq: Once | INTRAMUSCULAR | Status: AC
  Administered 2023-09-05: 10 mg via INTRAVENOUS
  Filled 2023-09-05: qty 2

## 2023-09-05 MED ORDER — LACTATED RINGERS IV BOLUS
1000.0000 mL | Freq: Once | INTRAVENOUS | Status: AC
  Administered 2023-09-05: 1000 mL via INTRAVENOUS

## 2023-09-05 NOTE — Discharge Instructions (Addendum)
 Testing is reassuring.  No evidence of stroke or serious spine problem.  If symptoms persist he may need MRI.  Follow-up with your neurologist as scheduled. You are also being referred to the cardiologist as well as you do have some episodes of irregular heartbeat.  They may want you to wear a monitor at home.  Return to the ED with dizziness, chest pain, shortness of breath, lightheadedness   Take your medications as prescribed.  Return to the ED with new or worsening symptoms.

## 2023-09-05 NOTE — ED Provider Notes (Signed)
 [place] EMERGENCY DEPARTMENT AT [facility] Provider Note   CSN: [id number] Arrival date & time: 09/05/23  0415     History  Chief Complaint  Patient presents with   Numbness    BL hands and feet    Logan Dunn is a 28 y.o. male.  Patient with a history of hypertension and diabetes here with "numbness" to his bilateral hands and feet.  He is a poor historian.  States symptoms started about 7 PM last night involving both of his feet on the plantar surface and fingertips bilaterally but worse on the left side.  He has had this before many months ago.  Does not carry diagnosis of neuropathy but is prescribed gabapentin for an unknown reason.  He denies weakness in his arms or legs.  No difficulty speaking or difficulty swallowing.  No room spinning dizziness or lightheadedness.  Reports decreased sensation to both feet worse on the left.  Also with numbness and tingling to his fingertips bilaterally.  Significant other reports "eyes were fluttering" while he was sleeping and she was concerned he could be having a seizure.  He was seen a couple months ago for syncope versus seizure and referred to neurology which he has not seen yet.  No definite seizure activity today.  No tongue biting or incontinence. His low back has been hurting for the past 3 weeks after falling at work.  Pain is constant.  No radiation of the pain down his legs.  No history of IV drug abuse or cancer.  Gradual onset headache this morning as well.  No thunderclap onset.  The history is provided by the patient and the spouse.       Home Medications Prior to Admission medications   Medication Sig Start Date End Date Taking? Authorizing Provider  Accu-Chek Softclix Lancets lancets Use as instructed to check blood sugars up to 3 times per day 10/06/22   Hoy Register, MD  acetone, urine, test strip Check ketones per protocol 08/03/15   Casimiro Needle, MD  Alcohol Swabs (ALCOHOL PADS) 70 % PADS Use  to clean skin prior to injections 2 times daily 08/03/15   Casimiro Needle, MD  Blood Glucose Monitoring Suppl (ACCU-CHEK GUIDE ME) w/Device KIT Use as instructed to check blood sugars up to 3 times per day 10/06/22   Hoy Register, MD  Continuous Glucose Receiver (FREESTYLE LIBRE 2 READER) DEVI Use as instructed to check blood sugars up to 3 times per day 10/06/22   Hoy Register, MD  Continuous Glucose Sensor (FREESTYLE LIBRE 2 SENSOR) MISC Use as instructed to check blood sugars up to 3 times per day 10/06/22   Hoy Register, MD  gabapentin (NEURONTIN) 300 MG capsule Take 1 capsule (300 mg total) by mouth at bedtime. 06/23/23   Georganna Skeans, MD  glucagon 1 MG injection Inject 1mg  IM if unconscious, seizing, or unable to eat to correct low blood sugar 08/03/15   Casimiro Needle, MD  glucose blood (ACCU-CHEK GUIDE) test strip Use as instructed to check blood sugars up to 3 times per day 10/06/22   Hoy Register, MD  ibuprofen (ADVIL) 200 MG tablet Take 800 mg by mouth every 6 (six) hours as needed for mild pain or moderate pain.    [provider]  insulin glargine-yfgn (SEMGLEE, YFGN,) 100 UNIT/ML Pen Inject 14 Units into the skin daily. 10/24/22   Hoy Register, MD  Insulin Pen Needle 32G X 4 MM MISC Use to inject insulin once daily.  10/06/22   Hoy Register, MD  lisinopril (ZESTRIL) 10 MG tablet Take 1 tablet (10 mg total) by mouth daily. 10/06/22   Hoy Register, MD  lisinopril (ZESTRIL) 20 MG tablet Take 1 tablet (20 mg total) by mouth daily. 06/20/23   Georganna Skeans, MD  metFORMIN (GLUCOPHAGE) 500 MG tablet Take 1 tablet (500 mg total) by mouth 2 (two) times daily with a meal. 06/22/23   Georganna Skeans, MD      Allergies    Patient has no known allergies.    Review of Systems   Review of Systems  Constitutional:  Negative for activity change, appetite change and fever.  HENT:  Negative for congestion.   Respiratory:  Negative for cough, chest tightness and  shortness of breath.   Cardiovascular:  Negative for chest pain.  Gastrointestinal:  Negative for abdominal pain, nausea and vomiting.  Genitourinary:  Negative for dysuria.  Musculoskeletal:  Positive for back pain. Negative for arthralgias and myalgias.  Skin:  Negative for rash.  Neurological:  Positive for numbness. Negative for weakness.   all other systems are negative except as noted in the HPI and PMH.    Physical Exam Updated Vital Signs BP (!) 169/127   Pulse 70   Temp 98.2 F (36.8 C) (Oral)   Resp 16   Ht 6\' 2"  (1.88 m)   Wt 111.1 kg   SpO2 100%   BMI 31.46 kg/m  Physical Exam Vitals and nursing note reviewed.  Constitutional:      General: He is not in acute distress.    Appearance: He is well-developed.  HENT:     Head: Normocephalic and atraumatic.     Mouth/Throat:     Pharynx: No oropharyngeal exudate.  Eyes:     Conjunctiva/sclera: Conjunctivae normal.     Pupils: Pupils are equal, round, and reactive to light.  Neck:     Comments: No meningismus. Cardiovascular:     Rate and Rhythm: Normal rate and regular rhythm.     Heart sounds: Normal heart sounds. No murmur heard. Pulmonary:     Effort: Pulmonary effort is normal. No respiratory distress.     Breath sounds: Normal breath sounds.  Abdominal:     Palpations: Abdomen is soft.     Tenderness: There is no abdominal tenderness. There is no guarding or rebound.  Musculoskeletal:        General: Tenderness present. Normal range of motion.     Cervical back: Normal range of motion and neck supple.     Comments: Lumbar spine, no step-offs  5/5 strength in bilateral lower extremities. Ankle plantar and dorsiflexion intact. Great toe extension intact bilaterally. +2 DP and PT pulses.  Normal gait.  Subjective paresthesias to bottom of feet bilaterally.  Subjective paresthesias to fingertips bilaterally.  No grip strength weakness.  Intact radial pulses.  Skin:    General: Skin is warm.   Neurological:     Mental Status: He is alert and oriented to person, place, and time.     Cranial Nerves: No cranial nerve deficit.     Motor: No abnormal muscle tone.     Coordination: Coordination normal.     Comments:  5/5 strength throughout. CN 2-12 intact.Equal grip strength.   Psychiatric:        Behavior: Behavior normal.     ED Results / Procedures / Treatments   Labs (all labs ordered are listed, but only abnormal results are displayed) Labs Reviewed  COMPREHENSIVE METABOLIC PANEL WITH GFR -  Abnormal; Notable for the following components:      Result Value   Glucose, Bld 255 (*)    Calcium 8.6 (*)    All other components within normal limits  CBG MONITORING, ED - Abnormal; Notable for the following components:   Glucose-Capillary 250 (*)    All other components within normal limits  CBC WITH DIFFERENTIAL/PLATELET  CK  TROPONIN I (HIGH SENSITIVITY)  TROPONIN I (HIGH SENSITIVITY)    EKG EKG Interpretation Date/Time:  Tuesday September 05 2023 04:34:10 EDT Ventricular Rate:  59 PR Interval:  171 QRS Duration:  100 QT Interval:  375 QTC Calculation: 372 R Axis:   115  Text Interpretation: Sinus rhythm Anteroseptal infarct, old Confirmed by Glynn Octave [phone]) on 09/05/2023 4:38:24 AM  Radiology CT Lumbar Spine Wo Contrast Result Date: 09/05/2023 CLINICAL DATA:  28 year old male with extremity numbness and tingling since 1900 hours. EXAM: CT LUMBAR SPINE WITHOUT CONTRAST TECHNIQUE: Multidetector CT imaging of the lumbar spine was performed without intravenous contrast administration. Multiplanar CT image reconstructions were also generated. RADIATION DOSE REDUCTION: This exam was performed according to the departmental dose-optimization program which includes automated exposure control, adjustment of the mA and/or kV according to patient size and/or use of iterative reconstruction technique. COMPARISON:  None Available. FINDINGS: Segmentation: Normal assuming  hypoplastic ribs at T12. Full size ribs at T11. Correlation with radiographs is recommended prior to any operative intervention. Alignment: Maintained lumbar lordosis. No significant scoliosis or spondylolisthesis. Vertebrae: Bone mineralization is within normal limits. Maintained vertebral body height. S1 spina bifida occulta, normal variant. No acute osseous abnormality identified. Intact visible sacrum and SI joints. Paraspinal and other soft tissues: Negative visible noncontrast abdominal viscera. Negative paraspinal soft tissues. Disc levels: T11-T12 and T12-L1:  Negative. L1-L2:  Negative. L2-L3:  Subtle disc bulging and endplate spurring.  No stenosis. L3-L4: Subtle disc bulging and endplate spurring, primarily at the foramina. Up to mild L3 neural foraminal stenosis greater on the left. L4-L5: Mild circumferential disc bulge. Mild endplate spurring and facet hypertrophy. No evidence of spinal or lateral recess stenosis. Borderline to mild L4 foraminal stenosis. L5-S1:  Mild disc bulging, endplate spurring.  No stenosis. IMPRESSION: 1. No acute osseous abnormality in the Lumbar Spine. 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 2. Minimal to mild lumbar spine degeneration with no spinal stenosis. Up to mild L3 and L4 neural foraminal stenosis. Electronically Signed   By: Odessa Fleming M.D.   On: 09/05/2023 05:51   CT Cervical Spine Wo Contrast Result Date: 09/05/2023 CLINICAL DATA:  28 year old male with extremity numbness and tingling since 1900 hours. EXAM: CT CERVICAL SPINE WITHOUT CONTRAST TECHNIQUE: Multidetector CT imaging of the cervical spine was performed without intravenous contrast. Multiplanar CT image reconstructions were also generated. RADIATION DOSE REDUCTION: This exam was performed according to the departmental dose-optimization program which includes automated exposure control, adjustment of the mA and/or kV according to patient size and/or use of iterative reconstruction  technique. COMPARISON:  Head CT today. FINDINGS: Alignment: Straightening of cervical lordosis. Cervicothoracic junction alignment is within normal limits. Bilateral posterior element alignment is within normal limits. Skull base and vertebrae: Bone mineralization is within normal limits. Visualized skull base is intact. No atlanto-occipital dissociation. C1 and C2 appear intact. Mild degenerative appearing ossific fragments along the anterior inferior C1-C2 articulation. No other osseous abnormality identified. Soft tissues and spinal canal: No prevertebral fluid or swelling. No visible canal hematoma. Negative visible noncontrast neck soft tissues. Disc levels:  Negative. Upper chest: Minimally included,  negative. Other: Chronic maxillary sinus mucous retention cysts. IMPRESSION: Straightening of cervical lordosis but otherwise normal CT appearance of the Cervical spine. Electronically Signed   By: Odessa Fleming M.D.   On: 09/05/2023 05:48   CT Head Wo Contrast Result Date: 09/05/2023 CLINICAL DATA:  28 year old male with extremity numbness and tingling since 1900 hours. EXAM: CT HEAD WITHOUT CONTRAST TECHNIQUE: Contiguous axial images were obtained from the base of the skull through the vertex without intravenous contrast. RADIATION DOSE REDUCTION: This exam was performed according to the departmental dose-optimization program which includes automated exposure control, adjustment of the mA and/or kV according to patient size and/or use of iterative reconstruction technique. COMPARISON:  Head CT 06/11/2023. FINDINGS: Brain: Normal cerebral volume. No midline shift, ventriculomegaly, mass effect, evidence of mass lesion, intracranial hemorrhage or evidence of cortically based acute infarction. Gray-white matter differentiation is within normal limits throughout the brain. Vascular: No suspicious intracranial vascular hyperdensity. Skull: Stable, intact. Sinuses/Orbits: Visualized paranasal sinuses and mastoids are  stable and well aerated. Other: Visualized orbits and scalp soft tissues are within normal limits. IMPRESSION: Stable and normal noncontrast Head CT. Electronically Signed   By: Odessa Fleming M.D.   On: 09/05/2023 05:46    Procedures Procedures    Medications Ordered in ED Medications  lactated ringers bolus 1,000 mL (has no administration in time range)    ED Course/ Medical Decision Making/ A&P                                 Medical Decision Making Amount and/or Complexity of Data Reviewed Independent Historian: spouse Labs: ordered. Decision-making details documented in ED Course. Radiology: ordered and independent interpretation performed. Decision-making details documented in ED Course. ECG/medicine tests: ordered and independent interpretation performed. Decision-making details documented in ED Course.  Risk Prescription drug management.   Diabetic with bilateral tingling and numbness to his hands and feet since 7 PM.  No weakness.  Given bilateral nature, low concern for CVA or TIA.  No weakness. Suspect neuropathy.  Low suspicion for cord compression or cauda equina.  Will obtain labs to ensure no electrolyte abnormalities. Intact distal strength, sensation, pulses.  Unable to elicit patellar reflexes bilaterally.  Hyperglycemia without DKA. Electrolytes and CK normal. Hypertension noted.  Did take his lisinopril last night.  Denies any missed doses.  Denies any thunderclap onset headache.  Headache is gradual in onset.  Low suspicion for subarachnoid hemorrhage, meningitis, temporal arteritis.  CT head and C-spine negative for acute pathology.  CT lumbar spine shows minimal degenerative changes and foraminal stenosis without fracture or large disc protrusion. Results reviewed and interpreted by me.  Paresthesias have improved with IV fluids.  Blood pressure has improved to 138/90.  Patient does have some episodes of apparent sinus arrhythmia throughout his ED course with  variable heart rates from the 40s to 80s.  EKG shows no evidence of high degree heart block.  He is asymptomatic.  Workup is reassuring.  Will refer to cardiology for his arrhythmia as well as neurology for his paresthesias.  He does have an upcoming appoint with neurology on April 26.  Will attempt to move this up.  Do not feel emergent transfer for MRI necessary with low suspicion for CVA or acute spinal cord pathology.  Return to the ED with new or worsening symptoms.       Final Clinical Impression(s) / ED Diagnoses Final diagnoses:  Paresthesias  Sinus arrhythmia  Rx / DC Orders ED Discharge Orders     None         Cully Luckow, Jeannett Senior, MD 09/05/23 [phone]

## 2023-09-05 NOTE — ED Triage Notes (Signed)
 Pt began feeling numbness and tingling in feet and hands at around 1900 last night. Pt woke up with worsened numbness and tingling to BL hands and feet, states hands and feet were red and hot.Pt has headache and dry mouth as well. Neuro check wnl.

## 2023-09-13 ENCOUNTER — Encounter: Payer: Self-pay | Admitting: Cardiovascular Disease

## 2023-09-13 ENCOUNTER — Ambulatory Visit: Attending: Cardiovascular Disease | Admitting: Cardiovascular Disease

## 2023-09-13 VITALS — BP 122/86 | HR 83 | Ht 74.0 in | Wt 254.0 lb

## 2023-09-13 DIAGNOSIS — R9431 Abnormal electrocardiogram [ECG] [EKG]: Secondary | ICD-10-CM

## 2023-09-13 NOTE — Progress Notes (Signed)
 Chief Complaint  Patient presents with   New Patient (Initial Visit)    Abnormal EKG   History of Present Illness: 28 yo Dunn with history of type 1 DM and HTN here today as a new patient for the evaluation of an abnormal EKG. He was seen in the ED on 09/05/23 with pareshesias in his hands. EKG with sinus with non-specific ST and T wave abnormalities, poor R wave progression. He tells me today that he feels well. He has no chest pain, dyspnea, palpitations, near syncope or syncope.   Primary Care Physician: Georganna Skeans, MD   Past Medical History:  Diagnosis Date   Diabetes [facility])    Diabetes mellitus without complication (HCC)    Hypertension    Numbness and tingling in both hands    Numbness and tingling of both feet    Paresthesias    Sinus arrhythmia     Past Surgical History:  Procedure Laterality Date   NO PAST SURGERIES      Current Outpatient Medications  Medication Sig Dispense Refill   Accu-Chek Softclix Lancets lancets Use as instructed to check blood sugars up to 3 times per day 100 each 6   acetone, urine, test strip Check ketones per protocol 50 each 3   Alcohol Swabs (ALCOHOL PADS) 70 % PADS Use to clean skin prior to injections 2 times daily 200 each 6   Blood Glucose Monitoring Suppl (ACCU-CHEK GUIDE ME) w/Device KIT Use as instructed to check blood sugars up to 3 times per day 1 kit 0   Continuous Glucose Receiver (FREESTYLE LIBRE 2 READER) DEVI Use as instructed to check blood sugars up to 3 times per day 1 each 0   Continuous Glucose Sensor (FREESTYLE LIBRE 2 SENSOR) MISC Use as instructed to check blood sugars up to 3 times per day 2 each 6   gabapentin (NEURONTIN) 300 MG capsule Take 1 capsule (300 mg total) by mouth at bedtime. 90 capsule 0   glucagon 1 MG injection Inject 1mg  IM if unconscious, seizing, or unable to eat to correct low blood sugar 2 each 2   glucose blood (ACCU-CHEK GUIDE) test strip Use as instructed to check blood sugars up to 3 times  per day 100 each 6   ibuprofen (ADVIL) 200 MG tablet Take 800 mg by mouth every 6 (six) hours as needed for mild pain or moderate pain.     insulin glargine-yfgn (SEMGLEE, YFGN,) 100 UNIT/ML Pen Inject 14 Units into the skin daily. 6 mL 2   Insulin Pen Needle 32G X 4 MM MISC Use to inject insulin once daily. 100 each 2   lisinopril (ZESTRIL) 20 MG tablet Take 1 tablet (20 mg total) by mouth daily. 90 tablet 1   metFORMIN (GLUCOPHAGE) 500 MG tablet Take 1 tablet (500 mg total) by mouth 2 (two) times daily with a meal. 180 tablet 1   lisinopril (ZESTRIL) 10 MG tablet Take 1 tablet (10 mg total) by mouth daily. 90 tablet 1   No current facility-administered medications for this visit.    No Known Allergies  Social History   Socioeconomic History   Marital status: Single    Spouse name: Not on file   Number of children: Not on file   Years of education: Not on file   Highest education level: 10th grade  Occupational History   Not on file  Tobacco Use   Smoking status: Never   Smokeless tobacco: Never  Vaping Use   Vaping  status: Never Used  Substance and Sexual Activity   Alcohol use: No   Drug use: No   Sexual activity: Not Currently    Birth control/protection: Condom  Other Topics Concern   Not on file  Social History Narrative   ** Merged History Encounter **       Social Drivers of Health   Financial Resource Strain: High Risk (06/22/2023)   Overall Financial Resource Strain (CARDIA)    Difficulty of Paying Living Expenses: Hard  Food Insecurity: Food Insecurity Present (06/22/2023)   Hunger Vital Sign    Worried About Running Out of Food in the Last Year: Sometimes true    Ran Out of Food in the Last Year: Sometimes true  Transportation Needs: No Transportation Needs (06/22/2023)   PRAPARE - Administrator, Civil Service (Medical): No    Lack of Transportation (Non-Medical): No  Physical Activity: Sufficiently Active (09/29/2022)   Exercise Vital Sign     Days of Exercise per Week: 5 days    Minutes of Exercise per Session: 60 min  Stress: Stress Concern Present (06/22/2023)   [employer] of Occupational Health - Occupational Stress Questionnaire    Feeling of Stress : To some extent  Social Connections: Socially Isolated (06/22/2023)   Social Connection and Isolation Panel [NHANES]    Frequency of Communication with Friends and Family: More than three times a week    Frequency of Social Gatherings with Friends and Family: More than three times a week    Attends Religious Services: Never    Database administrator or Organizations: No    Attends Engineer, structural: Not on file    Marital Status: Never married  Catering manager Violence: Not on file    Family History  Problem Relation Age of Onset   Hypertension Mother    Heart disease Mother    Diabetes Father    Hypertension Father    Hypertension Maternal Grandmother    Diabetes Maternal Grandmother    Kidney disease Maternal Grandmother    Cancer Maternal Grandfather     Review of Systems:  As stated in the HPI and otherwise negative.   BP 122/86   Pulse 83   Ht 6\' 2"  (1.88 m)   Wt 115.2 kg   SpO2 96%   BMI 32.61 kg/m   Physical Examination: General: Well developed, well nourished, NAD  HEENT: OP clear, mucus membranes moist  SKIN: warm, dry. No rashes. Neuro: No focal deficits  Musculoskeletal: Muscle strength 5/5 all ext  Psychiatric: Mood and affect normal  Neck: No JVD, no carotid bruits, no thyromegaly, no lymphadenopathy.  Lungs:Clear bilaterally, no wheezes, rhonci, crackles Cardiovascular: Regular rate and rhythm. No murmurs, gallops or rubs. Abdomen:Soft. Bowel sounds present. Non-tender.  Extremities: No lower extremity edema. Pulses are 2 + in the bilateral DP/PT.  EKG:  EKG is not ordered today. The ekg ordered today demonstrates   Recent Labs: 09/05/2023: ALT 20; BUN 8; Creatinine, Ser 0.71; Hemoglobin 13.9; Platelets 284; Potassium  4.2; Sodium 135   Lipid Panel No results found for: "CHOL", "TRIG", "HDL", "CHOLHDL", "VLDL", "LDLCALC", "LDLDIRECT"   Wt Readings from Last 3 Encounters:  09/13/23 115.2 kg  09/05/23 111.1 kg  06/20/23 115.8 kg    Assessment and Plan:   1. Abnormal EKG: He has no worrisome symptoms. Will arrange an echo to assess LV size and function and exclude structural heart disease.   Labs/ tests ordered today include:   Orders Placed This  Encounter  Procedures   ECHOCARDIOGRAM COMPLETE   Disposition:   F/U with me as needed. Echo results to be called to patient  Signed, Verne Carrow, MD, [facility] 09/13/2023 10:25 AM    [facility] [address] [place], [place], [place]  [address] Phone: [phone]; Fax: [phone]

## 2023-09-13 NOTE — Patient Instructions (Signed)
 Medication Instructions:  No changes *If you need a refill on your cardiac medications before your next appointment, please call your pharmacy*  Lab Work: none  Testing/Procedures: Your physician has requested that you have an echocardiogram. Echocardiography is a painless test that uses sound waves to create images of your heart. It provides your doctor with information about the size and shape of your heart and how well your heart's chambers and valves are working. This procedure takes approximately one hour. There are no restrictions for this procedure. Please do NOT wear cologne, perfume, aftershave, or lotions (deodorant is allowed). Please arrive 15 minutes prior to your appointment time.  Please note: We ask at that you not bring children with you during ultrasound (echo/ vascular) testing. Due to room size and safety concerns, children are not allowed in the ultrasound rooms during exams. Our front office staff cannot provide observation of children in our lobby area while testing is being conducted. An adult accompanying a patient to their appointment will only be allowed in the ultrasound room at the discretion of the ultrasound technician under special circumstances. We apologize for any inconvenience.   Follow-Up: As needed     1st Floor: - Lobby - Registration  - Pharmacy  - Lab - Cafe  2nd Floor: - PV Lab - Diagnostic Testing (echo, CT, nuclear med)  3rd Floor: - Vacant  4th Floor: - TCTS (cardiothoracic surgery) - AFib Clinic - Structural Heart Clinic - Vascular Surgery  - Vascular Ultrasound  5th Floor: - [facility] Cardiology (general and EP) - Clinical Pharmacy for coumadin, hypertension, lipid, weight-loss medications, and med management appointments    Valet parking services will be available as well.

## 2023-09-19 ENCOUNTER — Ambulatory Visit: Payer: BLUE CROSS/BLUE SHIELD | Admitting: Family Medicine

## 2023-09-19 VITALS — BP 124/73 | HR 70 | Temp 98.0°F | Resp 16 | Ht 74.0 in | Wt 253.4 lb

## 2023-09-19 DIAGNOSIS — E1169 Type 2 diabetes mellitus with other specified complication: Secondary | ICD-10-CM

## 2023-09-19 DIAGNOSIS — I10 Essential (primary) hypertension: Secondary | ICD-10-CM

## 2023-09-19 DIAGNOSIS — E119 Type 2 diabetes mellitus without complications: Secondary | ICD-10-CM | POA: Diagnosis not present

## 2023-09-19 LAB — POCT HEMOGLOBIN: Hemoglobin: 10.5 g/dL — AB (ref 11–14.6)

## 2023-09-19 NOTE — Progress Notes (Unsigned)
 Established Patient Office Visit  Subjective    Patient ID: Logan Dunn, male    DOB: [birth year]  Age: 28 y.o. MRN: [id number]  CC:  Chief Complaint  Patient presents with   Medical Management of Chronic Issues    HPI Quasim Prettyman presents for routine follow up of chronic med issues including diabetes and hypertension. Patient reports med compliance and denies acute complaints.   Outpatient Encounter Medications as of 09/19/2023  Medication Sig   Accu-Chek Softclix Lancets lancets Use as instructed to check blood sugars up to 3 times per day   acetone, urine, test strip Check ketones per protocol   Alcohol Swabs (ALCOHOL PADS) 70 % PADS Use to clean skin prior to injections 2 times daily   Blood Glucose Monitoring Suppl (ACCU-CHEK GUIDE ME) w/Device KIT Use as instructed to check blood sugars up to 3 times per day   Continuous Glucose Receiver (FREESTYLE LIBRE 2 READER) DEVI Use as instructed to check blood sugars up to 3 times per day   Continuous Glucose Sensor (FREESTYLE LIBRE 2 SENSOR) MISC Use as instructed to check blood sugars up to 3 times per day   gabapentin (NEURONTIN) 300 MG capsule Take 1 capsule (300 mg total) by mouth at bedtime.   glucagon 1 MG injection Inject 1mg  IM if unconscious, seizing, or unable to eat to correct low blood sugar   glucose blood (ACCU-CHEK GUIDE) test strip Use as instructed to check blood sugars up to 3 times per day   ibuprofen (ADVIL) 200 MG tablet Take 800 mg by mouth every 6 (six) hours as needed for mild pain or moderate pain.   insulin glargine-yfgn (SEMGLEE, YFGN,) 100 UNIT/ML Pen Inject 14 Units into the skin daily.   Insulin Pen Needle 32G X 4 MM MISC Use to inject insulin once daily.   lisinopril (ZESTRIL) 20 MG tablet Take 1 tablet (20 mg total) by mouth daily.   metFORMIN (GLUCOPHAGE) 500 MG tablet Take 1 tablet (500 mg total) by mouth 2 (two) times daily with a meal.   [DISCONTINUED] lisinopril (ZESTRIL) 10 MG tablet Take 1 tablet  (10 mg total) by mouth daily.   No facility-administered encounter medications on file as of 09/19/2023.    Past Medical History:  Diagnosis Date   Diabetes (HCC)    Diabetes mellitus without complication (HCC)    Hypertension    Numbness and tingling in both hands    Numbness and tingling of both feet    Paresthesias    Sinus arrhythmia     Past Surgical History:  Procedure Laterality Date   NO PAST SURGERIES      Family History  Problem Relation Age of Onset   Hypertension Mother    Heart disease Mother    Diabetes Father    Hypertension Father    Hypertension Maternal Grandmother    Diabetes Maternal Grandmother    Kidney disease Maternal Grandmother    Cancer Maternal Grandfather     Social History   Socioeconomic History   Marital status: Single    Spouse name: Not on file   Number of children: Not on file   Years of education: Not on file   Highest education level: 10th grade  Occupational History   Not on file  Tobacco Use   Smoking status: Never   Smokeless tobacco: Never  Vaping Use   Vaping status: Never Used  Substance and Sexual Activity   Alcohol use: No   Drug use: No   Sexual  activity: Not Currently    Birth control/protection: Condom  Other Topics Concern   Not on file  Social History Narrative   ** Merged History Encounter **       Social Drivers of Health   Financial Resource Strain: High Risk (06/22/2023)   Overall Financial Resource Strain (CARDIA)    Difficulty of Paying Living Expenses: Hard  Food Insecurity: Food Insecurity Present (06/22/2023)   Hunger Vital Sign    Worried About Running Out of Food in the Last Year: Sometimes true    Ran Out of Food in the Last Year: Sometimes true  Transportation Needs: No Transportation Needs (06/22/2023)   PRAPARE - Administrator, Civil Service (Medical): No    Lack of Transportation (Non-Medical): No  Physical Activity: Sufficiently Active (09/29/2022)   Exercise Vital Sign     Days of Exercise per Week: 5 days    Minutes of Exercise per Session: 60 min  Stress: Stress Concern Present (06/22/2023)   [employer] of Occupational Health - Occupational Stress Questionnaire    Feeling of Stress : To some extent  Social Connections: Socially Isolated (06/22/2023)   Social Connection and Isolation Panel [NHANES]    Frequency of Communication with Friends and Family: More than three times a week    Frequency of Social Gatherings with Friends and Family: More than three times a week    Attends Religious Services: Never    Database administrator or Organizations: No    Attends Engineer, structural: Not on file    Marital Status: Never married  Intimate Partner Violence: Not on file    Review of Systems  All other systems reviewed and are negative.       Objective    BP 124/73   Pulse 70   Temp 98 F (36.7 C) (Oral)   Resp 16   Ht 6\' 2"  (1.88 m)   Wt 253 lb 6.4 oz (114.9 kg)   SpO2 95%   BMI 32.53 kg/m   Physical Exam Vitals and nursing note reviewed.  Constitutional:      General: He is not in acute distress. Cardiovascular:     Rate and Rhythm: Normal rate and regular rhythm.  Pulmonary:     Effort: Pulmonary effort is normal.     Breath sounds: Normal breath sounds.  Abdominal:     Palpations: Abdomen is soft.     Tenderness: There is no abdominal tenderness.  Neurological:     General: No focal deficit present.     Mental Status: He is alert and oriented to person, place, and time.         Assessment & Plan:  1. Type 2 diabetes mellitus with other specified complication, without long-term current use of insulin (HCC) (Primary) Improved A1c but still above goal. Continue  - POCT hemoglobin  2. Essential hypertension Appears stable. Continue     Return in about 3 months (around 12/19/2023) for follow up, chronic med issues.   Arlo Lama, MD

## 2023-09-21 ENCOUNTER — Encounter: Payer: Self-pay | Admitting: Family Medicine

## 2023-09-26 ENCOUNTER — Ambulatory Visit (INDEPENDENT_AMBULATORY_CARE_PROVIDER_SITE_OTHER): Payer: BLUE CROSS/BLUE SHIELD | Admitting: Neurology

## 2023-09-26 ENCOUNTER — Encounter: Payer: Self-pay | Admitting: Neurology

## 2023-09-26 VITALS — BP 116/69 | HR 55 | Ht 74.0 in | Wt 255.5 lb

## 2023-09-26 DIAGNOSIS — R55 Syncope and collapse: Secondary | ICD-10-CM | POA: Diagnosis not present

## 2023-09-26 DIAGNOSIS — R569 Unspecified convulsions: Secondary | ICD-10-CM | POA: Diagnosis not present

## 2023-09-26 NOTE — Progress Notes (Signed)
 [facility]  PATIENT: Logan Dunn DOB: 1996-01-13  REQUESTING CLINICIAN: Sueellen Emery, MD HISTORY FROM: Patient and girlfriend  REASON FOR VISIT: Seizure like activity    HISTORICAL  CHIEF COMPLAINT:  Chief Complaint  Patient presents with   New Patient (Initial Visit)    Rm13, girlfriend present, NP internal referral for Seizure-like activity: last episode 2 months ago, baby girl on the way     HISTORY OF PRESENT ILLNESS:  This is a 28 year old gentleman past medical history diabetes mellitus, hypertension who is presenting after a single episode of seizure activity.  History was mainly obtained from patient and girlfriend.  Patient was having a conversation about their pregnancy, and patient started complaining of tingling then he passed out and went into a convulsion.  He was in and out of convulsion lasting about 20-minutes.  EMS was called and patient was taken to the hospital.  In the hospital, he was mainly complaining of tingling mainly in the extremity.  He tells me that he does not remember the event, he remembers talking to his girlfriend, and next thing is waking up in the hospital.  This was the first event.  Since then he has not had any additional event.  He denies any seizure risk factors.  Denies any injury, no tongue biting, no urinary incontinence.   Handedness: Right   Onset: January 2025  Seizure Type: Loss of consciousness, convulsion  Current frequency: Only once   Any injuries from seizures: Denies   Seizure risk factors: None reported   Previous ASMs: None   Currenty ASMs: None   ASMs side effects: N/A  Brain Images: Normal head CT   Previous EEGs: Not previously done    OTHER MEDICAL CONDITIONS: Diabetes Mellitus, Hypertension   REVIEW OF SYSTEMS: Full 14 system review of systems performed and negative with exception of: As noted in the HPI   ALLERGIES: No Known Allergies  HOME MEDICATIONS: Outpatient  Medications Prior to Visit  Medication Sig Dispense Refill   Accu-Chek Softclix Lancets lancets Use as instructed to check blood sugars up to 3 times per day 100 each 6   acetone, urine, test strip Check ketones per protocol 50 each 3   Alcohol  Swabs (ALCOHOL  PADS) 70 % PADS Use to clean skin prior to injections 2 times daily 200 each 6   Blood Glucose Monitoring Suppl (ACCU-CHEK GUIDE ME) w/Device KIT Use as instructed to check blood sugars up to 3 times per day 1 kit 0   Continuous Glucose Receiver (FREESTYLE LIBRE 2 READER) DEVI Use as instructed to check blood sugars up to 3 times per day 1 each 0   Continuous Glucose Sensor (FREESTYLE LIBRE 2 SENSOR) MISC Use as instructed to check blood sugars up to 3 times per day 2 each 6   gabapentin  (NEURONTIN ) 300 MG capsule Take 1 capsule (300 mg total) by mouth at bedtime. 90 capsule 0   glucagon  1 MG injection Inject 1mg  IM if unconscious, seizing, or unable to eat to correct low blood sugar 2 each 2   glucose blood (ACCU-CHEK GUIDE) test strip Use as instructed to check blood sugars up to 3 times per day 100 each 6   insulin  glargine-yfgn (SEMGLEE , YFGN,) 100 UNIT/ML Pen Inject 14 Units into the skin daily. 6 mL 2   Insulin  Pen Needle 32G X 4 MM MISC Use to inject insulin  once daily. 100 each 2   lisinopril  (ZESTRIL ) 20 MG tablet Take 1 tablet (20 mg total) by mouth  daily. 90 tablet 1   metFORMIN  (GLUCOPHAGE ) 500 MG tablet Take 1 tablet (500 mg total) by mouth 2 (two) times daily with a meal. 180 tablet 1   ibuprofen  (ADVIL ) 200 MG tablet Take 800 mg by mouth every 6 (six) hours as needed for mild pain or moderate pain.     No facility-administered medications prior to visit.    PAST MEDICAL HISTORY: Past Medical History:  Diagnosis Date   Diabetes (HCC)    Diabetes mellitus without complication (HCC)    Hypertension    Numbness and tingling in both hands    Numbness and tingling of both feet    Paresthesias    Sinus arrhythmia     PAST  SURGICAL HISTORY: Past Surgical History:  Procedure Laterality Date   NO PAST SURGERIES      FAMILY HISTORY: Family History  Problem Relation Age of Onset   Hypertension Mother    Heart disease Mother    Diabetes Father    Hypertension Father    Hypertension Maternal Grandmother    Diabetes Maternal Grandmother    Kidney disease Maternal Grandmother    Cancer Maternal Grandfather     SOCIAL HISTORY: Social History   Socioeconomic History   Marital status: Significant Other    Spouse name: amya   Number of children: 0   Years of education: Not on file   Highest education level: 10th grade  Occupational History   Not on file  Tobacco Use   Smoking status: Never   Smokeless tobacco: Never  Vaping Use   Vaping status: Never Used  Substance and Sexual Activity   Alcohol  use: No   Drug use: No   Sexual activity: Not Currently    Birth control/protection: Condom  Other Topics Concern   Not on file  Social History Narrative   ** Merged History Encounter **       Social Drivers of Health   Financial Resource Strain: High Risk (06/22/2023)   Overall Financial Resource Strain (CARDIA)    Difficulty of Paying Living Expenses: Hard  Food Insecurity: Food Insecurity Present (06/22/2023)   Hunger Vital Sign    Worried About Running Out of Food in the Last Year: Sometimes true    Ran Out of Food in the Last Year: Sometimes true  Transportation Needs: No Transportation Needs (06/22/2023)   PRAPARE - Administrator, Civil Service (Medical): No    Lack of Transportation (Non-Medical): No  Physical Activity: Sufficiently Active (09/29/2022)   Exercise Vital Sign    Days of Exercise per Week: 5 days    Minutes of Exercise per Session: 60 min  Stress: Stress Concern Present (06/22/2023)   [employer] of Occupational Health - Occupational Stress Questionnaire    Feeling of Stress : To some extent  Social Connections: Socially Isolated (06/22/2023)   Social  Connection and Isolation Panel [NHANES]    Frequency of Communication with Friends and Family: More than three times a week    Frequency of Social Gatherings with Friends and Family: More than three times a week    Attends Religious Services: Never    Database administrator or Organizations: No    Attends Engineer, structural: Not on file    Marital Status: Never married  Intimate Partner Violence: Not on file    PHYSICAL EXAM  GENERAL EXAM/CONSTITUTIONAL: Vitals:  Vitals:   09/26/23 0852  BP: 116/69  Pulse: (!) 55  Weight: 255 lb 8 oz (115.9 kg)  Height: 6\' 2"  (1.88 m)   Body mass index is 32.8 kg/m. Wt Readings from Last 3 Encounters:  09/26/23 255 lb 8 oz (115.9 kg)  09/19/23 253 lb 6.4 oz (114.9 kg)  09/13/23 254 lb (115.2 kg)   Patient is in no distress; well developed, nourished and groomed; neck is supple  MUSCULOSKELETAL: Gait, strength, tone, movements noted in Neurologic exam below  NEUROLOGIC: MENTAL STATUS:      No data to display         awake, alert, oriented to person, place and time recent and remote memory intact normal attention and concentration language fluent, comprehension intact, naming intact fund of knowledge appropriate  CRANIAL NERVE:  2nd, 3rd, 4th, 6th - Visual fields full to confrontation, extraocular muscles intact, no nystagmus 5th - facial sensation symmetric 7th - facial strength symmetric 8th - hearing intact 9th - palate elevates symmetrically, uvula midline 11th - shoulder shrug symmetric 12th - tongue protrusion midline  MOTOR:  normal bulk and tone, full strength in the BUE, BLE  SENSORY:  normal and symmetric to light touch  COORDINATION:  finger-nose-finger, fine finger movements normal  GAIT/STATION:  normal   DIAGNOSTIC DATA (LABS, IMAGING, TESTING) - I reviewed patient records, labs, notes, testing and imaging myself where available.  Lab Results  Component Value Date   WBC 7.5 09/05/2023    HGB 10.5 (A) 09/19/2023   HCT 40.2 09/05/2023   MCV 82.4 09/05/2023   PLT 284 09/05/2023      Component Value Date/Time   NA 135 09/05/2023 0447   K 4.2 09/05/2023 0447   CL 101 09/05/2023 0447   CO2 27 09/05/2023 0447   GLUCOSE 255 (H) 09/05/2023 0447   BUN 8 09/05/2023 0447   CREATININE 0.71 09/05/2023 0447   CALCIUM  8.6 (L) 09/05/2023 0447   PROT 7.1 09/05/2023 0447   ALBUMIN 3.9 09/05/2023 0447   AST 17 09/05/2023 0447   ALT 20 09/05/2023 0447   ALKPHOS 60 09/05/2023 0447   BILITOT 0.4 09/05/2023 0447   GFRNONAA >60 09/05/2023 0447   GFRAA >60 03/13/2019 1239   No results found for: "CHOL", "HDL", "LDLCALC", "LDLDIRECT", "TRIG" Lab Results  Component Value Date   HGBA1C 11.1 (A) 06/20/2023   No results found for: "VITAMINB12" Lab Results  Component Value Date   TSH 0.318 (L) 07/31/2015    CT Head 06/11/2023 Unremarkable CT appearance of the brain   I personally reviewed brain Images  ASSESSMENT AND PLAN  28 y.o. year old male  with history of diabetes mellitus, hypertension who is presenting after a seizure-like activity.  This was in the setting of increased stressors, conversation about pregnancy/unborn baby.  This was patient first event and he has not had any additional events since then.  No injuries, no urinary incontinence, no tongue biting.  Patient event is likely a nonepileptic spell but we will obtain a routine EEG to rule out seizures.  I will contact him to go over the result.  If normal patient can continue to follow with PCP and return as needed.  They are comfortable with plans.   1. Syncope, unspecified syncope type   2. Seizure-like activity [facility])     Patient Instructions  Routine EEG, I will contact you to go over the results  Continue your other medications  Continue to follow up with PCP  Return as needed    Per [place]  DMV statutes, patients with seizures are not allowed to drive until they have been seizure-free for  six months.   Other recommendations include using caution when using heavy equipment or power tools. Avoid working on ladders or at heights. Take showers instead of baths.  Do not swim alone.  Ensure the water temperature is not too high on the home water heater. Do not go swimming alone. Do not lock yourself in a room alone (i.e. bathroom). When caring for infants or small children, sit down when holding, feeding, or changing them to minimize risk of injury to the child in the event you have a seizure. Maintain good sleep hygiene. Avoid alcohol .  Also recommend adequate sleep, hydration, good diet and minimize stress.   During the Seizure  - First, ensure adequate ventilation and place patients on the floor on their left side  Loosen clothing around the neck and ensure the airway is patent. If the patient is clenching the teeth, do not force the mouth open with any object as this can cause severe damage - Remove all items from the surrounding that can be hazardous. The patient may be oblivious to what's happening and may not even know what he or she is doing. If the patient is confused and wandering, either gently guide him/her away and block access to outside areas - Reassure the individual and be comforting - Call 911. In most cases, the seizure ends before EMS arrives. However, there are cases when seizures may last over 3 to 5 minutes. Or the individual may have developed breathing difficulties or severe injuries. If a pregnant patient or a person with diabetes develops a seizure, it is prudent to call an ambulance. - Finally, if the patient does not regain full consciousness, then call EMS. Most patients will remain confused for about 45 to 90 minutes after a seizure, so you must use judgment in calling for help. - Avoid restraints but make sure the patient is in a bed with padded side rails - Place the individual in a lateral position with the neck slightly flexed; this will help the saliva drain from the  mouth and prevent the tongue from falling backward - Remove all nearby furniture and other hazards from the area - Provide verbal assurance as the individual is regaining consciousness - Provide the patient with privacy if possible - Call for help and start treatment as ordered by the caregiver   After the Seizure (Postictal Stage)  After a seizure, most patients experience confusion, fatigue, muscle pain and/or a headache. Thus, one should permit the individual to sleep. For the next few days, reassurance is essential. Being calm and helping reorient the person is also of importance.  Most seizures are painless and end spontaneously. Seizures are not harmful to others but can lead to complications such as stress on the lungs, brain and the heart. Individuals with prior lung problems may develop labored breathing and respiratory distress.    Discussed Patients with epilepsy have a small risk of sudden unexpected death, a condition referred to as sudden unexpected death in epilepsy (SUDEP). SUDEP is defined specifically as the sudden, unexpected, witnessed or unwitnessed, nontraumatic and nondrowning death in patients with epilepsy with or without evidence for a seizure, and excluding documented status epilepticus, in which post mortem examination does not reveal a structural or toxicologic cause for death     Orders Placed This Encounter  Procedures   EEG adult    No orders of the defined types were placed in this encounter.   Return if symptoms worsen or fail to improve.    Jacquilyn Seldon  Samara Crest, MD 09/26/2023, 12:49 PM  [facility] [address], [address] [place], [place] [address] [phone]

## 2023-09-26 NOTE — Patient Instructions (Signed)
 Routine EEG, I will contact you to go over the results  Continue your other medications  Continue to follow up with PCP  Return as needed

## 2023-09-27 ENCOUNTER — Ambulatory Visit: Admitting: Neurology

## 2023-09-27 DIAGNOSIS — R55 Syncope and collapse: Secondary | ICD-10-CM

## 2023-09-27 NOTE — Procedures (Signed)
    History:  28 year old man with seizure vs. Syncope   EEG classification: Awake and drowsy  Duration: 25 minutes   Technical aspects: This EEG study was done with scalp electrodes positioned according to the 10-20 International system of electrode placement. Electrical activity was reviewed with band pass filter of 1-70Hz , sensitivity of 7 uV/mm, display speed of 21mm/sec with a 60Hz  notched filter applied as appropriate. EEG data were recorded continuously and digitally stored.   Description of the recording: The background rhythms of this recording consists of a fairly well modulated medium amplitude alpha rhythm of 11 Hz that is reactive to eye opening and closure. Present in the anterior head region is a 15-20 Hz beta activity. Photic stimulation was performed, did not show any abnormalities. Hyperventilation was also performed, did not show any abnormalities. Drowsiness was manifested by background fragmentation. No abnormal epileptiform discharges seen during this recording. There was no focal slowing. There were no electrographic seizure identified.   Abnormality: None   Impression: This is a normal awake and drowsy EEG. No evidence of interictal epileptiform discharges. Normal EEGs, however, do not rule out epilepsy.    [place], MD [facility]

## 2023-09-28 ENCOUNTER — Encounter: Payer: Self-pay | Admitting: Neurology

## 2023-10-13 ENCOUNTER — Ambulatory Visit: Attending: Nurse Practitioner | Admitting: Pharmacist

## 2023-10-13 ENCOUNTER — Other Ambulatory Visit: Payer: Self-pay

## 2023-10-13 ENCOUNTER — Encounter: Payer: Self-pay | Admitting: Pharmacist

## 2023-10-13 DIAGNOSIS — Z7984 Long term (current) use of oral hypoglycemic drugs: Secondary | ICD-10-CM

## 2023-10-13 DIAGNOSIS — Z794 Long term (current) use of insulin: Secondary | ICD-10-CM | POA: Diagnosis not present

## 2023-10-13 DIAGNOSIS — E119 Type 2 diabetes mellitus without complications: Secondary | ICD-10-CM | POA: Diagnosis not present

## 2023-10-13 MED ORDER — FREESTYLE LIBRE 3 PLUS SENSOR MISC
6 refills | Status: DC
Start: 2023-10-13 — End: 2024-01-08
  Filled 2023-10-13: qty 2, 28d supply, fill #0

## 2023-10-13 MED ORDER — FREESTYLE LIBRE 3 READER DEVI
0 refills | Status: AC
  Filled 2023-10-13: qty 1, 365d supply, fill #0

## 2023-10-13 MED ORDER — INSULIN GLARGINE-YFGN 100 UNIT/ML ~~LOC~~ SOPN
10.0000 [IU] | PEN_INJECTOR | Freq: Every day | SUBCUTANEOUS | 2 refills | Status: DC
  Filled 2023-10-13: qty 6, 56d supply, fill #0

## 2023-10-13 MED ORDER — LANTUS SOLOSTAR 100 UNIT/ML ~~LOC~~ SOPN
10.0000 [IU] | PEN_INJECTOR | Freq: Every day | SUBCUTANEOUS | 2 refills | Status: DC
  Filled 2023-10-13: qty 3, 28d supply, fill #0

## 2023-10-13 MED ORDER — INSULIN PEN NEEDLE 32G X 4 MM MISC
2 refills | Status: DC
  Filled 2023-10-13: qty 100, 34d supply, fill #0

## 2023-10-13 NOTE — Progress Notes (Signed)
 S:     No chief complaint on file.  28 y.o. male who presents for diabetes evaluation, education, and management.  PMH is significant for T2DM.  Patient was referred and last seen by Primary Care Provider, Dr. Elvan Hamel, on 09/19/2023. A1c at that visit was 10.5%. He was referred to me for further management.   Today, patient arrives in good spirits and presents with his partner. Patient's diabetes was diagnosed in 2017. He presented with DKA with no prior hx of DM. Age at the time was 28 YO. There was some thought of T1 vs T2 DM. Tests were negative for insulin  antibodies at the time. GAD and anti-islet cell antibody tests were negative. C peptide revealed minimal endogenous insulin  secretion. He was placed on insulin  at that time.   I last saw him on 10/06/2022. At the time he was with his mom. I started him on Semglee  insulin . Unfortunately, he was lost to follow-up.    Today, he admits to being without insulin  for some time. Gives reason of feeling bad when he takes it (feels paraesthesia in feet and hands along with a migraine HA). He has gone without insulin  in the past d/t gaps in insurance coverage. He tells us  today that he has BCBS and is about to change insurance with new employer starting soon. No clinical ASCVD, CKD, or CHF. No thyroid cancer. No pancreatitis hx. He is not checking sugars at home - gives reason that his phone and sensors "stopped syncing up" but I show that he last filled his sensor back in 2024 and never filled it again.  Family/Social History:  -Fhx: HTN, heart disease, DM -Tobacco: never smoker  -Alcohol : none reported   Current diabetes medications include: metformin  500 mg BID, Semglee  10 u daily Patient denies adherence d/t side effects.   Insurance coverage: BCBS  Patient denies hypoglycemic events.  Reported home fasting blood sugars: not checking   Reported 2 hour post-meal/random blood sugars: not checking  Patient reports nocturia (nighttime  urination).  Patient reports neuropathy (nerve pain). Patient reports visual changes. Patient reports self foot exams.   Patient reported dietary habits: Eats 3 meals/day Breakfast: cereal w/ milk Lunch: usually something quick like fast food  Dinner: usually brings something home from work like Midwife: tries to limit sweets  Drinks: water, regular sodas (3-4 sodas a day), Lemonade  Patient-reported exercise habits:  -Works 7 days a week for ~ 6 hours. Tells me he is very active at work.  O:  Lab Results  Component Value Date   HGBA1C 11.1 (A) 06/20/2023   There were no vitals filed for this visit.  Lipid Panel  No results found for: "CHOL", "TRIG", "HDL", "CHOLHDL", "VLDL", "LDLCALC", "LDLDIRECT"  Clinical Atherosclerotic Cardiovascular Disease (ASCVD): No  The ASCVD Risk score (Arnett DK, et al., 2019) failed to calculate for the following reasons:   The 2019 ASCVD risk score is only valid for ages 42 to 38   A/P: Diabetes longstanding currently uncontrolled. Patient is able to verbalize appropriate hypoglycemia management plan. Medication adherence is suboptimal. Of note, he has been filling his rxns at [facility]. I have advised him to fill with us  for financial assistance. We will restart basal insulin  and have him start checking sugars at home. I sent in updated rxns for [place] 3 plus. I anticipate the need for bolus insulin  but we will approach this in stages. Questionable as to what type he is - his c-peptide shows almost no endogenous  insulin  production. Other tests were negative when evaluating for T1 in 2017. I think we can keep metformin  on for now.  -Restarted Semglee . Per patient preference we will restart with 5 units for the first week. If tolerable, he has been instructed to increase to 10 units daily thereafter.  -Continue metformin  500 mg BID.   -Will consider meal time insulin  at follow-up. -CGM supplies sent.  -Patient educated on purpose, proper  use, and potential adverse effects of insulin .  -Extensively discussed pathophysiology of diabetes, recommended lifestyle interventions, dietary effects on blood sugar control.  -Counseled on s/sx of and management of hypoglycemia.  -Next A1c anticipated 12/2023.   Written patient instructions provided. Patient verbalized understanding of treatment plan.  Total time in face to face counseling 30 minutes.    Follow-up:  Pharmacist in 1 month.  Marene Shape, PharmD, Becky Bowels, CPP Clinical Pharmacist [facility] [phone]

## 2023-10-17 ENCOUNTER — Other Ambulatory Visit: Payer: Self-pay

## 2023-10-18 ENCOUNTER — Ambulatory Visit (HOSPITAL_COMMUNITY): Attending: Cardiology

## 2023-10-18 DIAGNOSIS — I340 Nonrheumatic mitral (valve) insufficiency: Secondary | ICD-10-CM | POA: Diagnosis not present

## 2023-10-18 DIAGNOSIS — R9431 Abnormal electrocardiogram [ECG] [EKG]: Secondary | ICD-10-CM

## 2023-10-18 LAB — ECHOCARDIOGRAM COMPLETE
Area-P 1/2: 5.75 cm2
S' Lateral: 3.6 cm

## 2023-10-19 ENCOUNTER — Ambulatory Visit: Payer: Self-pay | Admitting: Cardiovascular Disease

## 2023-11-13 ENCOUNTER — Ambulatory Visit: Admitting: Pharmacist

## 2023-11-28 ENCOUNTER — Ambulatory Visit: Admitting: Pharmacist

## 2023-11-30 ENCOUNTER — Telehealth: Payer: Self-pay | Admitting: Family Medicine

## 2023-11-30 NOTE — Telephone Encounter (Signed)
 Patient was identified as falling into the [place] Measure - Diabetes.   Patient was: Appointment already scheduled for:  12/19/2023.

## 2023-12-19 ENCOUNTER — Ambulatory Visit (INDEPENDENT_AMBULATORY_CARE_PROVIDER_SITE_OTHER): Admitting: Family Medicine

## 2023-12-19 VITALS — BP 138/83 | HR 69 | Ht 74.0 in | Wt 252.4 lb

## 2023-12-19 DIAGNOSIS — Z794 Long term (current) use of insulin: Secondary | ICD-10-CM

## 2023-12-19 DIAGNOSIS — F411 Generalized anxiety disorder: Secondary | ICD-10-CM | POA: Diagnosis not present

## 2023-12-19 DIAGNOSIS — E119 Type 2 diabetes mellitus without complications: Secondary | ICD-10-CM | POA: Diagnosis not present

## 2023-12-19 DIAGNOSIS — E1169 Type 2 diabetes mellitus with other specified complication: Secondary | ICD-10-CM

## 2023-12-19 LAB — POCT GLYCOSYLATED HEMOGLOBIN (HGB A1C): HbA1c, POC (controlled diabetic range): 11.6 % — AB (ref 0.0–7.0)

## 2023-12-19 MED ORDER — GABAPENTIN 300 MG PO CAPS: 300.0000 mg | ORAL_CAPSULE | Freq: Every day | ORAL | 0 refills | Status: AC

## 2023-12-19 MED ORDER — LANTUS SOLOSTAR 100 UNIT/ML ~~LOC~~ SOPN: 10.0000 [IU] | PEN_INJECTOR | Freq: Every day | SUBCUTANEOUS | 2 refills | Status: DC

## 2023-12-20 ENCOUNTER — Encounter: Payer: Self-pay | Admitting: Family Medicine

## 2023-12-20 NOTE — Progress Notes (Signed)
 Established Patient Office Visit  Subjective    Patient ID: Logan Dunn, male    DOB: 12-Jan-1996  Age: 28 y.o. MRN: [id number]  CC:  Chief Complaint  Patient presents with   Medical Management of Chronic Issues    HPI Logan Dunn presents for routine follow up of diabetes and anxiety. Patient reports med compliance but not diet compliance. He also would like a counselor because of his increasing anxiety. He has his first child due in the next month.   Outpatient Encounter Medications as of 12/19/2023  Medication Sig   Accu-Chek Softclix Lancets lancets Use as instructed to check blood sugars up to 3 times per day   acetone, urine, test strip Check ketones per protocol   Alcohol  Swabs (ALCOHOL  PADS) 70 % PADS Use to clean skin prior to injections 2 times daily   Blood Glucose Monitoring Suppl (ACCU-CHEK GUIDE ME) w/Device KIT Use as instructed to check blood sugars up to 3 times per day   Continuous Glucose Receiver (FREESTYLE LIBRE 3 READER) DEVI Use to check blood sugar continuously.   Continuous Glucose Sensor (FREESTYLE LIBRE 3 PLUS SENSOR) MISC Change sensor every 15 days. Use to check blood sugar continuously.   glucagon  1 MG injection Inject 1mg  IM if unconscious, seizing, or unable to eat to correct low blood sugar   glucose blood (ACCU-CHEK GUIDE) test strip Use as instructed to check blood sugars up to 3 times per day   Insulin  Pen Needle 32G X 4 MM MISC Use to inject insulin  once daily.   lisinopril  (ZESTRIL ) 20 MG tablet Take 1 tablet (20 mg total) by mouth daily.   metFORMIN  (GLUCOPHAGE ) 500 MG tablet Take 1 tablet (500 mg total) by mouth 2 (two) times daily with a meal.   [DISCONTINUED] gabapentin  (NEURONTIN ) 300 MG capsule Take 1 capsule (300 mg total) by mouth at bedtime.   [DISCONTINUED] insulin  glargine (LANTUS  SOLOSTAR) 100 UNIT/ML Solostar Pen Inject 10 Units into the skin daily.   gabapentin  (NEURONTIN ) 300 MG capsule Take 1 capsule (300 mg total) by mouth at  bedtime.   insulin  glargine (LANTUS  SOLOSTAR) 100 UNIT/ML Solostar Pen Inject 10 Units into the skin daily.   No facility-administered encounter medications on file as of 12/19/2023.    Past Medical History:  Diagnosis Date   Diabetes (HCC)    Diabetes mellitus without complication (HCC)    Hypertension    Numbness and tingling in both hands    Numbness and tingling of both feet    Paresthesias    Sinus arrhythmia     Past Surgical History:  Procedure Laterality Date   NO PAST SURGERIES      Family History  Problem Relation Age of Onset   Hypertension Mother    Heart disease Mother    Diabetes Father    Hypertension Father    Hypertension Maternal Grandmother    Diabetes Maternal Grandmother    Kidney disease Maternal Grandmother    Cancer Maternal Grandfather     Social History   Socioeconomic History   Marital status: Significant Other    Spouse name: amya   Number of children: 0   Years of education: Not on file   Highest education level: 10th grade  Occupational History   Not on file  Tobacco Use   Smoking status: Never   Smokeless tobacco: Never  Vaping Use   Vaping status: Never Used  Substance and Sexual Activity   Alcohol  use: No   Drug use: No  Sexual activity: Not Currently    Birth control/protection: Condom  Other Topics Concern   Not on file  Social History Narrative   ** Merged History Encounter **       Social Drivers of Health   Financial Resource Strain: High Risk (06/22/2023)   Overall Financial Resource Strain (CARDIA)    Difficulty of Paying Living Expenses: Hard  Food Insecurity: Food Insecurity Present (06/22/2023)   Hunger Vital Sign    Worried About Running Out of Food in the Last Year: Sometimes true    Ran Out of Food in the Last Year: Sometimes true  Transportation Needs: No Transportation Needs (06/22/2023)   PRAPARE - Administrator, Civil Service (Medical): No    Lack of Transportation (Non-Medical): No   Physical Activity: Sufficiently Active (09/29/2022)   Exercise Vital Sign    Days of Exercise per Week: 5 days    Minutes of Exercise per Session: 60 min  Stress: Stress Concern Present (06/22/2023)   [employer] of Occupational Health - Occupational Stress Questionnaire    Feeling of Stress : To some extent  Social Connections: Socially Isolated (06/22/2023)   Social Connection and Isolation Panel    Frequency of Communication with Friends and Family: More than three times a week    Frequency of Social Gatherings with Friends and Family: More than three times a week    Attends Religious Services: Never    Database administrator or Organizations: No    Attends Engineer, structural: Not on file    Marital Status: Never married  Intimate Partner Violence: Not on file    Review of Systems  Psychiatric/Behavioral:  Negative for substance abuse and suicidal ideas. The patient is nervous/anxious.   All other systems reviewed and are negative.       Objective    BP 138/83   Pulse 69   Ht 6' 2 (1.88 m)   Wt 252 lb 6.4 oz (114.5 kg)   SpO2 95%   BMI 32.41 kg/m   Physical Exam Vitals and nursing note reviewed.  Constitutional:      General: He is not in acute distress. Cardiovascular:     Rate and Rhythm: Normal rate and regular rhythm.  Pulmonary:     Effort: Pulmonary effort is normal.     Breath sounds: Normal breath sounds.  Abdominal:     Palpations: Abdomen is soft.     Tenderness: There is no abdominal tenderness.  Neurological:     General: No focal deficit present.     Mental Status: He is alert and oriented to person, place, and time.         Assessment & Plan:   1. Type 2 diabetes mellitus with other specified complication, without long-term current use of insulin  (HCC) (Primary) Increasing A1c. Referral to [facility] and diabetic education for med management.  - POCT glycosylated hemoglobin (Hb A1C) - Microalbumin / creatinine urine ratio -  Ambulatory referral to diabetic education  2. Anxiety state Referral for counseling.  - Ambulatory referral to Psychology    Return in about 3 months (around 03/20/2024).   Tanda Raguel SQUIBB, MD

## 2024-01-05 ENCOUNTER — Telehealth: Payer: Self-pay | Admitting: Family Medicine

## 2024-01-05 NOTE — Telephone Encounter (Signed)
 Confirmed appt for 8/4

## 2024-01-08 ENCOUNTER — Encounter: Payer: Self-pay | Admitting: Pharmacist

## 2024-01-08 ENCOUNTER — Ambulatory Visit: Attending: Family Medicine | Admitting: Pharmacist

## 2024-01-08 ENCOUNTER — Other Ambulatory Visit: Payer: Self-pay

## 2024-01-08 DIAGNOSIS — R202 Paresthesia of skin: Secondary | ICD-10-CM | POA: Diagnosis not present

## 2024-01-08 DIAGNOSIS — Z7984 Long term (current) use of oral hypoglycemic drugs: Secondary | ICD-10-CM | POA: Diagnosis not present

## 2024-01-08 DIAGNOSIS — E119 Type 2 diabetes mellitus without complications: Secondary | ICD-10-CM | POA: Diagnosis not present

## 2024-01-08 DIAGNOSIS — Z794 Long term (current) use of insulin: Secondary | ICD-10-CM | POA: Diagnosis not present

## 2024-01-08 MED ORDER — FREESTYLE LIBRE 3 PLUS SENSOR MISC
6 refills | Status: AC
  Filled 2024-01-08: qty 2, 30d supply, fill #0

## 2024-01-08 MED ORDER — LANTUS SOLOSTAR 100 UNIT/ML ~~LOC~~ SOPN
15.0000 [IU] | PEN_INJECTOR | Freq: Every day | SUBCUTANEOUS | 2 refills | Status: AC
  Filled 2024-01-08: qty 3, 15d supply, fill #0

## 2024-01-08 MED ORDER — INSULIN PEN NEEDLE 32G X 4 MM MISC
2 refills | Status: AC
  Filled 2024-01-08: qty 100, 34d supply, fill #0

## 2024-01-08 MED ORDER — METFORMIN HCL ER 500 MG PO TB24
500.0000 mg | ORAL_TABLET | Freq: Two times a day (BID) | ORAL | 2 refills | Status: AC
  Filled 2024-01-08: qty 120, 30d supply, fill #0

## 2024-01-08 NOTE — Progress Notes (Signed)
 S:     No chief complaint on file.  28 y.o. male who presents for diabetes evaluation, education, and management. Patient arrives in good spirits and presents without any assistance. Patient is accompanied by girlfriend and baby.   Patient was referred and last seen by Primary Care Provider, Dr. Tanda, on 12/19/23.  At last visit, patient's A1c was up 11.6 from 11.1 in 06/2023. Patient has been seen for DM management by Herlene, PharmD back in 10/2022 and 10/2023. During 10/2022, patient was started on Semglee  insulin  and was lost to follow up without insulin  for some time. In 10/2023, patient was restarted on insulin  at 10 units daily along with metformin  500 mg BID.  Today, patient's girlfriend reports having headaches that are light sensitive. Furthermore, he will feel sweaty with shaky hands and feet. Patient states that when he took higher doses of insulin , he did not feel shaky or sweaty. Patient reports that he has not been able to use the [employer] [facility] [employer] [place] sensors as he cannot get into his account. He has been able to check his blood sugars via finger sticks. Patient's girlfriend requests refills on all meds. Patient and girlfriend are motivated to manage DM.   Patient reports Diabetes was diagnosed in 2017. Presented with DKA with no prior hx of DM. Tests were negative for insulin  antibodies at the time. GAD and anti-islet cell antibody tests were negative. C peptide revealed minimal endogenous insulin  secretion. He was placed on insulin  at that time.   Family/Social History:  -Fhx: HTN, heart disease, DM -Tobacco: never smoker  -Alcohol : none reported   Current diabetes medications include: Lantus  10 units once daily, metformin  500 mg BID  Current hypertension medications include: lisinopril  20 mg once daily  Patient reports non-adherence to taking all medications as prescribed. Patient has lost all medications d/t being thrown away while moving. Reports taking metformin  as needed instead  of prescribed.  Do you feel that your medications are working for you? yes Have you been experiencing any side effects to the medications prescribed? no Do you have any problems obtaining medications due to transportation or finances? yes Insurance coverage: [place] Medicaid Healthy Blue  Patient denies hypoglycemic events.  Reported home fasting blood sugars: 230s, down from 500s  Reported 2 hour post-meal/random blood sugars: not reported.  Patient denies nocturia (nighttime urination).  Patient reports neuropathy (nerve pain). Currently taking gabapentin  300 mg at bedtime Patient denies visual changes. Patient denies self foot exams.   Patient reported dietary habits: Eats 3 meals/day Breakfast: bacon, pancakes, some eggs, plain bagel Lunch: sandwiches Dinner: chicken (baked, grilled), homemade, lots of veggies Snacks: chips, no more sweets Drinks: lots of water, and [place] lemonade  Patient-reported exercise habits: lots of walking  O:   ROS  Physical Exam   Lab Results  Component Value Date   HGBA1C 11.6 (A) 12/19/2023   There were no vitals filed for this visit.  Clinical Atherosclerotic Cardiovascular Disease (ASCVD): No  The ASCVD Risk score (Arnett DK, et al., 2019) failed to calculate for the following reasons:   The 2019 ASCVD risk score is only valid for ages 89 to 49   Patient is participating in a Managed Medicaid Plan:  Yes, [place] Medicaid Healthy Blue  A/P: Diabetes longstanding currently uncontrolled and above goal at 11.6%. Patient is able to verbalize appropriate hypoglycemia management plan. Medication adherence is suboptimal.  Today we discussed increasing Lantus  back up to 20 units with a 2 week titration along with titrating  metformin  up to the max tolerated dose. GLP1 could be considered in the future when sugars start to decrease. SGLT2 to be considered when A1c comes down to avoid any genitourinary s/sx. Encouraged to continue healthy diet and  walking. At next appointment, patient will hopefully set up with Herlene to be able to review BG data for further insulin  management. Patient is able to verbalize new medication regimen.  -Increased dose of  Lantus  (insulin  glargine) from 10 units to 15 units daily in the morning for 1 week. Patient will increase to 20 units once daily in the morning if tolerated. -Increased dose of metformin  500 mg BID to 1000 mg BID.  -Patient educated on purpose, proper use, and potential adverse effects of metformin  and Lantus . Instructed patient to contact Herlene if experiencing any N/V/D from metformin  to decrease dose. -Next A1c anticipated 03/20/2024.   Written patient instructions provided. Patient verbalized understanding of treatment plan.  Total time in face to face counseling 45 minutes.    Follow-up:  Pharmacist 2 weeks (02/22/24) for insulin  titration phone call. 1 mo follow up in clinic on 02/22/24. PCP clinic visit in clinic on10/15/2025 with Dr. Tanda.   Jenkins Graces, PharmD PGY1 Pharmacy Resident

## 2024-01-22 ENCOUNTER — Other Ambulatory Visit (HOSPITAL_BASED_OUTPATIENT_CLINIC_OR_DEPARTMENT_OTHER): Admitting: Pharmacist

## 2024-01-22 DIAGNOSIS — Z794 Long term (current) use of insulin: Secondary | ICD-10-CM

## 2024-01-22 DIAGNOSIS — Z7984 Long term (current) use of oral hypoglycemic drugs: Secondary | ICD-10-CM

## 2024-01-22 DIAGNOSIS — E119 Type 2 diabetes mellitus without complications: Secondary | ICD-10-CM

## 2024-01-22 NOTE — Progress Notes (Signed)
    S:     No chief complaint on file.  28 y.o. male who presents for diabetes evaluation, education, and management. Patient arrives in good spirits and presents without any assistance. Patient is accompanied by girlfriend and baby.   Patient was referred and last seen by Primary Care Provider, Dr. Tanda, on 12/19/23. Pharmacy saw him on 01/08/2024. We increased his insulin . Insulin  dose was increased to 15 units daily and he was instructed to increase further if home sugars remained above goal.   Today, I called patient's girlfriend. She tells me his sugars are still running in the 200s but he has only been injecting 15 units daily. He takes metformin  as two tablets (1000 mg total) in the morning only.  Family/Social History:  -Fhx: HTN, heart disease, DM -Tobacco: never smoker  -Alcohol : none reported   Current diabetes medications include: Lantus  15 units once daily, metformin  500 mg BID (takes 2 tablets in the morning) Current hypertension medications include: lisinopril  20 mg once daily  Patient reports adherence to taking all medications as prescribed.   Insurance coverage: [place] Medicaid Healthy Blue  Patient denies hypoglycemic events.  Reported home fasting blood sugars: reports 200s  Patient denies nocturia (nighttime urination).  Patient reports neuropathy (nerve pain). Currently taking gabapentin  300 mg at bedtime Patient denies visual changes. Patient denies self foot exams.   Patient reported dietary habits: Eats 3 meals/day Breakfast: bacon, pancakes, some eggs, plain bagel Lunch: sandwiches Dinner: chicken (baked, grilled), homemade, lots of veggies Snacks: chips, no more sweets Drinks: lots of water, and [place] lemonade  Patient-reported exercise habits: lots of walking  O:  Lab Results  Component Value Date   HGBA1C 11.6 (A) 12/19/2023   There were no vitals filed for this visit.  Clinical Atherosclerotic Cardiovascular Disease (ASCVD): No  The  ASCVD Risk score (Arnett DK, et al., 2019) failed to calculate for the following reasons:   The 2019 ASCVD risk score is only valid for ages 21 to 65   Patient is participating in a Managed Medicaid Plan:  Yes, [place] Medicaid Healthy Blue  A/P: Diabetes longstanding currently uncontrolled. Per patient's girlfriend, patient has not titrated insulin  past 15 units daily. I have instructed to increase to 20 units daily. Patient is able to verbalize appropriate hypoglycemia management plan. Medication adherence is okay.   -Increased dose of  Lantus  (insulin  glargine) from 15 units to 20 units daily in the morning for 1 week. I will call her again in 1 week to check in. -Continued metformin  1000 mg once daily (take two of the 500 mg XR tablets).  -Patient educated on purpose, proper use, and potential adverse effects of metformin  and Lantus . Instructed patient to contact Herlene if experiencing any N/V/D from metformin  to decrease dose. -Next A1c anticipated 03/20/2024.   Written patient instructions provided. Patient verbalized understanding of treatment plan.  Total time in face to face counseling 45 minutes.    Follow-up:  Pharmacist 1 week (01/29/24) for insulin  titration phone call. 1 mo follow up in clinic on 02/22/24. PCP clinic visit in clinic on 03/20/2024 with Dr. Tanda.  Herlene Fleeta Morris, PharmD, JAQUELINE, CPP Clinical Pharmacist [facility] [phone]

## 2024-02-21 NOTE — Progress Notes (Deleted)
 S:     No chief complaint on file.  28 y.o. male who presents for diabetes evaluation, education, and management. Patient was referred and last seen by Primary Care Provider, Dr. Tanda, on 12/19/23. During visit with Dr. Tanda, patient's A1c was up 11.6 from 11.1 in 06/2023. Patient has been seen for DM management by Herlene, PharmD back in 10/2022 and 10/2023. During 10/2022, patient was started on Semglee  insulin  and was lost to follow up without insulin  for some time. In 10/2023, patient was restarted on insulin  at 10 units daily along with metformin  500 mg BID.  Patient reports Diabetes was diagnosed in 2017. Presented with DKA with no prior hx of DM. Tests were negative for insulin  antibodies at the time. GAD and anti-islet cell antibody tests were negative. C peptide revealed minimal endogenous insulin  secretion. He was placed on insulin  at that time.   Patient last saw pharmacy 01/08/24. Patient had reported having headache and reports of shaky hands and feet. Stated this did not happen with higher insulin  doses and wanted to retrial higher insulin . Lantus  was titrated from 10 units to 15 units to 20 units and metformin  increased to 1000 mg BID.   Today patient reports in good spirits. Tolerating insulin ? Hypo? Hyper? Side effects? Shaky? Sweaty? Tingling? Checking BG at home? How many times? Readings? Potentially set up libre   Family/Social History:  -Fhx: HTN, heart disease, DM -Tobacco: never smoker  -Alcohol : none reported   Current diabetes medications include: Lantus  20 units once daily, metformin  1000 mg BID  Current hypertension medications include: lisinopril  20 mg once daily  Patient reports non-adherence to taking all medications as prescribed. Patient has lost all medications d/t being thrown away while moving. Reports taking metformin  as needed instead of prescribed.  Do you feel that your medications are working for you? yes Have you been experiencing any side effects to  the medications prescribed? no Do you have any problems obtaining medications due to transportation or finances? yes Insurance coverage: [place] Medicaid Healthy Blue  Patient denies hypoglycemic events.  Reported home fasting blood sugars: *** Reported 2 hour post-meal/random blood sugars: not reported.  Patient denies nocturia (nighttime urination).  Patient reports neuropathy (nerve pain). Currently taking gabapentin  300 mg at bedtime Patient denies visual changes. Patient denies self foot exams.   Patient reported dietary habits: Eats 3 meals/day Breakfast: bacon, pancakes, some eggs, plain bagel Lunch: sandwiches Dinner: chicken (baked, grilled), homemade, lots of veggies Snacks: chips, no more sweets Drinks: lots of water, and [place] lemonade  Patient-reported exercise habits: lots of walking  O:   ROS  Physical Exam   Lab Results  Component Value Date   HGBA1C 11.6 (A) 12/19/2023   There were no vitals filed for this visit.  Clinical Atherosclerotic Cardiovascular Disease (ASCVD): No  The ASCVD Risk score (Arnett DK, et al., 2019) failed to calculate for the following reasons:   The 2019 ASCVD risk score is only valid for ages 44 to 35   Patient is participating in a Managed Medicaid Plan:  Yes, [place] Medicaid Healthy Blue  A/P: Diabetes longstanding currently uncontrolled and above goal at 11.6%. Patient is able to verbalize appropriate hypoglycemia management plan. Medication adherence is suboptimal.  Today we discussed increasing Lantus  back up to 20 units with a 2 week titration along with titrating metformin  up to the max tolerated dose. GLP1 could be considered in the future when sugars start to decrease. SGLT2 to be considered when A1c comes down  to avoid any genitourinary s/sx. Encouraged to continue healthy diet and walking. At next appointment, patient will hopefully set up with Herlene to be able to review BG data for further insulin  management. Patient is able  to verbalize new medication regimen.  -Increased dose of  Lantus  (insulin  glargine) from 20 units to *** units daily in the morning.  -Increased dose of metformin  500 mg BID to 1000 mg BID.  -Patient educated on purpose, proper use, and potential adverse effects of metformin  and Lantus . Instructed patient to contact Herlene if experiencing any N/V/D from metformin  to decrease dose. -Next A1c anticipated 03/20/2024.   Written patient instructions provided. Patient verbalized understanding of treatment plan.  Total time in face to face counseling 45 minutes.    Follow-up:  Pharmacist: *** PCP clinic visit in clinic on 03/20/2024 with Dr. Tanda.   Jenkins Graces, PharmD PGY1 Pharmacy Resident

## 2024-02-22 ENCOUNTER — Ambulatory Visit: Admitting: Pharmacist

## 2024-03-04 ENCOUNTER — Ambulatory Visit: Admitting: Psychology

## 2024-03-13 ENCOUNTER — Telehealth: Payer: Self-pay

## 2024-03-13 ENCOUNTER — Other Ambulatory Visit: Payer: Self-pay

## 2024-03-13 NOTE — Telephone Encounter (Signed)
 Pharmacy Patient Advocate Encounter  Received notification from [employer] that Prior Authorization for FREESTYLE LIBRE 3 PLUS SENSOR has been APPROVED from 03/13/2024 to 09/09/2024   PA #/Case ID/Reference #: [id number]

## 2024-03-14 ENCOUNTER — Other Ambulatory Visit: Payer: Self-pay

## 2024-03-20 ENCOUNTER — Telehealth: Payer: Self-pay | Admitting: Family Medicine

## 2024-03-20 ENCOUNTER — Ambulatory Visit: Payer: Self-pay | Admitting: Family Medicine

## 2024-03-20 NOTE — Telephone Encounter (Signed)
 Called pt to reschedule missed appt; left message with pt girlfriend to call office back to reschedule.

## 2024-03-25 ENCOUNTER — Ambulatory Visit: Admitting: Psychology

## 2024-05-09 ENCOUNTER — Ambulatory Visit: Admitting: Neurology

## 2024-06-27 ENCOUNTER — Ambulatory Visit: Admitting: Family Medicine

## 2024-07-09 ENCOUNTER — Ambulatory Visit: Admitting: Family Medicine

## 2024-08-22 ENCOUNTER — Ambulatory Visit: Admitting: Family Medicine

## 2024-09-16 ENCOUNTER — Ambulatory Visit: Admitting: Neurology
